# Patient Record
Sex: Female | Born: 1960 | Race: White | Hispanic: No | Marital: Married | Smoking: Never smoker
Health system: Southern US, Community
[De-identification: ages and names within clinical notes are randomized; demographics above are authoritative.]

## PROBLEM LIST (undated history)

## (undated) DIAGNOSIS — N3010 Interstitial cystitis (chronic) without hematuria: Secondary | ICD-10-CM

## (undated) DIAGNOSIS — IMO0002 Reserved for concepts with insufficient information to code with codable children: Secondary | ICD-10-CM

## (undated) DIAGNOSIS — E669 Obesity, unspecified: Secondary | ICD-10-CM

## (undated) DIAGNOSIS — M1990 Unspecified osteoarthritis, unspecified site: Secondary | ICD-10-CM

## (undated) HISTORY — PX: CYSTOSCOPY: SUR368

## (undated) HISTORY — PX: WISDOM TOOTH EXTRACTION: SHX21

---

## 2005-01-26 ENCOUNTER — Ambulatory Visit: Payer: Self-pay | Admitting: Urology

## 2005-04-06 ENCOUNTER — Ambulatory Visit: Payer: Self-pay | Admitting: Obstetrics and Gynecology

## 2006-04-15 ENCOUNTER — Ambulatory Visit: Payer: Self-pay | Admitting: Obstetrics and Gynecology

## 2007-04-19 ENCOUNTER — Ambulatory Visit: Payer: Self-pay | Admitting: Obstetrics and Gynecology

## 2008-05-10 ENCOUNTER — Ambulatory Visit: Payer: Self-pay | Admitting: Unknown Physician Specialty

## 2009-05-13 ENCOUNTER — Ambulatory Visit: Payer: Self-pay | Admitting: Unknown Physician Specialty

## 2009-11-07 ENCOUNTER — Ambulatory Visit: Payer: Self-pay | Admitting: Unknown Physician Specialty

## 2010-05-15 ENCOUNTER — Ambulatory Visit: Payer: Self-pay | Admitting: Unknown Physician Specialty

## 2011-05-18 ENCOUNTER — Ambulatory Visit: Payer: Self-pay | Admitting: Unknown Physician Specialty

## 2012-04-22 ENCOUNTER — Ambulatory Visit: Payer: Self-pay | Admitting: Obstetrics and Gynecology

## 2012-05-24 ENCOUNTER — Ambulatory Visit: Payer: Self-pay | Admitting: Obstetrics and Gynecology

## 2013-06-14 ENCOUNTER — Ambulatory Visit: Payer: Self-pay | Admitting: Obstetrics and Gynecology

## 2013-06-20 ENCOUNTER — Ambulatory Visit: Payer: Self-pay | Admitting: Obstetrics and Gynecology

## 2013-08-23 ENCOUNTER — Ambulatory Visit: Payer: Self-pay | Admitting: Internal Medicine

## 2014-07-13 ENCOUNTER — Ambulatory Visit: Payer: Self-pay | Admitting: Obstetrics and Gynecology

## 2015-06-25 ENCOUNTER — Other Ambulatory Visit: Payer: Self-pay | Admitting: Obstetrics and Gynecology

## 2015-06-25 DIAGNOSIS — N644 Mastodynia: Secondary | ICD-10-CM

## 2015-07-15 ENCOUNTER — Ambulatory Visit
Admission: RE | Admit: 2015-07-15 | Discharge: 2015-07-15 | Disposition: A | Discharge status: Home / Self Care | Payer: BLUE CROSS/BLUE SHIELD | Source: Ambulatory Visit | Attending: Obstetrics and Gynecology | Admitting: Obstetrics and Gynecology

## 2015-07-15 ENCOUNTER — Ambulatory Visit: Payer: Self-pay

## 2015-07-15 DIAGNOSIS — N644 Mastodynia: Secondary | ICD-10-CM

## 2015-07-15 DIAGNOSIS — N63 Unspecified lump in breast: Secondary | ICD-10-CM | POA: Insufficient documentation

## 2015-07-15 DIAGNOSIS — R921 Mammographic calcification found on diagnostic imaging of breast: Secondary | ICD-10-CM | POA: Insufficient documentation

## 2015-07-16 ENCOUNTER — Other Ambulatory Visit: Payer: Self-pay | Admitting: Obstetrics and Gynecology

## 2015-07-16 DIAGNOSIS — R928 Other abnormal and inconclusive findings on diagnostic imaging of breast: Secondary | ICD-10-CM

## 2015-07-16 DIAGNOSIS — N6009 Solitary cyst of unspecified breast: Secondary | ICD-10-CM

## 2015-07-16 DIAGNOSIS — N63 Unspecified lump in unspecified breast: Secondary | ICD-10-CM

## 2015-07-16 DIAGNOSIS — R921 Mammographic calcification found on diagnostic imaging of breast: Secondary | ICD-10-CM

## 2015-07-17 ENCOUNTER — Ambulatory Visit
Admission: RE | Admit: 2015-07-17 | Discharge: 2015-07-17 | Disposition: A | Discharge status: Home / Self Care | Payer: BLUE CROSS/BLUE SHIELD | Source: Ambulatory Visit | Attending: Obstetrics and Gynecology | Admitting: Obstetrics and Gynecology

## 2015-07-17 ENCOUNTER — Other Ambulatory Visit: Payer: Self-pay | Admitting: Diagnostic Radiology

## 2015-07-17 ENCOUNTER — Other Ambulatory Visit: Payer: Self-pay | Admitting: Obstetrics and Gynecology

## 2015-07-17 DIAGNOSIS — N6009 Solitary cyst of unspecified breast: Secondary | ICD-10-CM

## 2015-07-17 DIAGNOSIS — R920 Mammographic microcalcification found on diagnostic imaging of breast: Secondary | ICD-10-CM | POA: Insufficient documentation

## 2015-07-17 DIAGNOSIS — R921 Mammographic calcification found on diagnostic imaging of breast: Secondary | ICD-10-CM

## 2015-07-17 DIAGNOSIS — N63 Unspecified lump in unspecified breast: Secondary | ICD-10-CM

## 2015-07-17 DIAGNOSIS — N6032 Fibrosclerosis of left breast: Secondary | ICD-10-CM | POA: Diagnosis not present

## 2015-07-17 DIAGNOSIS — R928 Other abnormal and inconclusive findings on diagnostic imaging of breast: Secondary | ICD-10-CM

## 2015-07-17 DIAGNOSIS — N6022 Fibroadenosis of left breast: Secondary | ICD-10-CM | POA: Diagnosis not present

## 2015-07-17 DIAGNOSIS — N62 Hypertrophy of breast: Secondary | ICD-10-CM | POA: Diagnosis not present

## 2015-07-17 DIAGNOSIS — O021 Missed abortion: Secondary | ICD-10-CM

## 2015-07-17 HISTORY — PX: BREAST BIOPSY: SHX20

## 2015-07-26 ENCOUNTER — Other Ambulatory Visit: Payer: Self-pay | Admitting: Pediatrics

## 2015-07-26 LAB — SURGICAL PATHOLOGY

## 2015-11-07 ENCOUNTER — Encounter: Payer: Self-pay | Admitting: *Deleted

## 2015-11-08 ENCOUNTER — Ambulatory Visit
Admission: RE | Admit: 2015-11-08 | Discharge: 2015-11-08 | Disposition: A | Discharge status: Home / Self Care | Payer: BLUE CROSS/BLUE SHIELD | Source: Ambulatory Visit | Attending: Unknown Physician Specialty | Admitting: Unknown Physician Specialty

## 2015-11-08 ENCOUNTER — Ambulatory Visit: Payer: BLUE CROSS/BLUE SHIELD | Admitting: Certified Registered Nurse Anesthetist

## 2015-11-08 ENCOUNTER — Encounter
Admission: RE | Disposition: A | Discharge status: Home / Self Care | Payer: Self-pay | Source: Ambulatory Visit | Attending: Unknown Physician Specialty

## 2015-11-08 DIAGNOSIS — E669 Obesity, unspecified: Secondary | ICD-10-CM | POA: Insufficient documentation

## 2015-11-08 DIAGNOSIS — K640 First degree hemorrhoids: Secondary | ICD-10-CM | POA: Diagnosis not present

## 2015-11-08 DIAGNOSIS — D122 Benign neoplasm of ascending colon: Secondary | ICD-10-CM | POA: Diagnosis not present

## 2015-11-08 DIAGNOSIS — Z1211 Encounter for screening for malignant neoplasm of colon: Secondary | ICD-10-CM | POA: Diagnosis present

## 2015-11-08 HISTORY — DX: Reserved for concepts with insufficient information to code with codable children: IMO0002

## 2015-11-08 HISTORY — DX: Interstitial cystitis (chronic) without hematuria: N30.10

## 2015-11-08 HISTORY — PX: COLONOSCOPY: SHX5424

## 2015-11-08 HISTORY — DX: Obesity, unspecified: E66.9

## 2015-11-08 SURGERY — COLONOSCOPY
Anesthesia: General

## 2015-11-08 MED ORDER — MIDAZOLAM HCL 5 MG/5ML IJ SOLN
INTRAMUSCULAR | Status: DC | PRN
  Administered 2015-11-08: 1 mg via INTRAVENOUS

## 2015-11-08 MED ORDER — LIDOCAINE HCL (CARDIAC) 20 MG/ML IV SOLN
INTRAVENOUS | Status: DC | PRN
  Administered 2015-11-08: 20 mg via INTRAVENOUS

## 2015-11-08 MED ORDER — PROPOFOL 10 MG/ML IV BOLUS
INTRAVENOUS | Status: DC | PRN
  Administered 2015-11-08: 40 mg via INTRAVENOUS

## 2015-11-08 MED ORDER — SODIUM CHLORIDE 0.9 % IV SOLN: INTRAVENOUS | Status: DC

## 2015-11-08 MED ORDER — PROPOFOL 500 MG/50ML IV EMUL
INTRAVENOUS | Status: DC | PRN
  Administered 2015-11-08: 1 ug/kg/min via INTRAVENOUS
  Administered 2015-11-08: 140 ug/kg/min via INTRAVENOUS

## 2015-11-08 MED ORDER — LIDOCAINE HCL (PF) 1 % IJ SOLN
2.0000 mL | Freq: Once | INTRAMUSCULAR | Status: AC
  Administered 2015-11-08: 2 mL via INTRADERMAL

## 2015-11-08 MED ORDER — LIDOCAINE HCL (PF) 1 % IJ SOLN
INTRAMUSCULAR | Status: AC
  Filled 2015-11-08: qty 2

## 2015-11-08 MED ORDER — SODIUM CHLORIDE 0.9 % IV SOLN
INTRAVENOUS | Status: DC
  Administered 2015-11-08: 1000 mL via INTRAVENOUS

## 2015-11-08 MED ORDER — FENTANYL CITRATE (PF) 100 MCG/2ML IJ SOLN
INTRAMUSCULAR | Status: DC | PRN
  Administered 2015-11-08: 50 ug via INTRAVENOUS

## 2015-11-08 NOTE — Anesthesia Preprocedure Evaluation (Addendum)
Anesthesia Evaluation  Patient identified by MRN, date of birth, ID band Patient awake    Reviewed: Allergy & Precautions, NPO status , Patient's Chart, lab work & pertinent test results  Airway Mallampati: III  TM Distance: <3 FB     Dental no notable dental hx.    Pulmonary neg pulmonary ROS,    Pulmonary exam normal        Cardiovascular Normal cardiovascular exam     Neuro/Psych negative neurological ROS  negative psych ROS   GI/Hepatic negative GI ROS, Neg liver ROS,   Endo/Other  negative endocrine ROS  Renal/GU negative Renal ROS Bladder dysfunction      Musculoskeletal negative musculoskeletal ROS (+)   Abdominal Normal abdominal exam  (+)   Peds  Hematology negative hematology ROS (+)   Anesthesia Other Findings Interstitial cystitis  Reproductive/Obstetrics                            Anesthesia Physical Anesthesia Plan  ASA: II  Anesthesia Plan: General   Post-op Pain Management:    Induction: Intravenous  Airway Management Planned: Nasal Cannula  Additional Equipment:   Intra-op Plan:   Post-operative Plan:   Informed Consent: I have reviewed the patients History and Physical, chart, labs and discussed the procedure including the risks, benefits and alternatives for the proposed anesthesia with the patient or authorized representative who has indicated his/her understanding and acceptance.   Dental advisory given  Plan Discussed with: CRNA and Surgeon  Anesthesia Plan Comments:         Anesthesia Quick Evaluation

## 2015-11-08 NOTE — H&P (Signed)
   Primary Care Physician:  Laverta Baltimore, MD Primary Gastroenterologist:  Dr. Vira Agar  Pre-Procedure History & Physical: HPI:  Theresa Mcmillan is a 54 y.o. female is here for an colonoscopy.   Past Medical History  Diagnosis Date  . Obesity   . Interstitial cystitis   . ASCUS favor benign     Past Surgical History  Procedure Laterality Date  . Cystoscopy    . Wisdom tooth extraction      Prior to Admission medications   Medication Sig Start Date End Date Taking? Authorizing Provider  pentosan polysulfate (ELMIRON) 100 MG capsule Take 100 mg by mouth 3 (three) times daily.   Yes Historical Provider, MD    Allergies as of 08/15/2015 - never reviewed  Allergen Reaction Noted  . Aleve [naproxen sodium] Hives 07/15/2015  . Tetanus toxoids  07/15/2015    History reviewed. No pertinent family history.  Social History   Social History  . Marital Status: Married    Spouse Name: N/A  . Number of Children: N/A  . Years of Education: N/A   Occupational History  . Not on file.   Social History Main Topics  . Smoking status: Never Smoker   . Smokeless tobacco: Not on file  . Alcohol Use: Not on file  . Drug Use: Not on file  . Sexual Activity: Not on file   Other Topics Concern  . Not on file   Social History Narrative    Review of Systems: See HPI, otherwise negative ROS  Physical Exam: BP 122/63 mmHg  Pulse 71  Temp(Src) 98.3 F (36.8 C) (Tympanic)  Resp 17  Ht 5\' 2"  (1.575 m)  Wt 51.71 kg (114 lb)  BMI 20.85 kg/m2  SpO2 100% General:   Alert,  pleasant and cooperative in NAD Head:  Normocephalic and atraumatic. Neck:  Supple; no masses or thyromegaly. Lungs:  Clear throughout to auscultation.    Heart:  Regular rate and rhythm. Abdomen:  Soft, nontender and nondistended. Normal bowel sounds, without guarding, and without rebound.   Neurologic:  Alert and  oriented x4;  grossly normal neurologically.  Impression/Plan: Theresa Mcmillan is here for an  colonoscopy to be performed for screening  Risks, benefits, limitations, and alternatives regarding  colonoscopy have been reviewed with the patient.  Questions have been answered.  All parties agreeable.   Gaylyn Cheers, MD  11/08/2015, 8:03 AM

## 2015-11-08 NOTE — Anesthesia Procedure Notes (Signed)
Date/Time: 11/08/2015 8:06 AM Performed by: Kennon Holter Pre-anesthesia Checklist: Patient identified, Emergency Drugs available, Suction available, Patient being monitored and Timeout performed Patient Re-evaluated:Patient Re-evaluated prior to inductionOxygen Delivery Method: Nasal cannula Preoxygenation: Pre-oxygenation with 100% oxygen Intubation Type: IV induction

## 2015-11-08 NOTE — Op Note (Signed)
[facility] Gastroenterology Patient Name: Theresa Mcmillan Procedure Date: 11/08/2015 8:04 AM MRN: [id number] Account #: [id number] Date of Birth: [birth year] Admit Type: Outpatient Age: 54 Room: [facility] ENDO ROOM 1 Gender: Female Note Status: Finalized Procedure:         Colonoscopy Indications:       Screening for colorectal malignant neoplasm Providers:         Manya Silvas, MD Medicines:         Propofol per Anesthesia Complications:     No immediate complications. Procedure:         Pre-Anesthesia Assessment:                    - After reviewing the risks and benefits, the patient was                     deemed in satisfactory condition to undergo the procedure.                    After obtaining informed consent, the colonoscope was                     passed under direct vision. Throughout the procedure, the                     patient's blood pressure, pulse, and oxygen saturations                     were monitored continuously. The Colonoscope was                     introduced through the anus and advanced to the the cecum,                     identified by appendiceal orifice and ileocecal valve. The                     colonoscopy was performed without difficulty. The patient                     tolerated the procedure well. The quality of the bowel                     preparation was excellent. Findings:      A very small polyp was found in the proximal ascending colon. The polyp       was sessile. The polyp was removed with a cold snare. Resection and       retrieval were complete.      Internal hemorrhoids were found during endoscopy. The hemorrhoids were       small and Grade I (internal hemorrhoids that do not prolapse).      The exam was otherwise without abnormality. Impression:        - One small polyp in the proximal ascending colon.                     Resected and retrieved.                    - Internal hemorrhoids.                    -  The examination was otherwise normal. Recommendation:    - Await pathology results. Manya Silvas, MD 11/08/2015 8:30:24 AM This report has been signed  electronically. Number of Addenda: 0 Note Initiated On: 11/08/2015 8:04 AM Scope Withdrawal Time: 0 hours 12 minutes 2 seconds  Total Procedure Duration: 0 hours 19 minutes 34 seconds       [facility]

## 2015-11-08 NOTE — Transfer of Care (Signed)
Immediate Anesthesia Transfer of Care Note  Patient: Theresa Mcmillan  Procedure(s) Performed: Procedure(s): COLONOSCOPY (N/A)  Patient Location: PACU and Endoscopy Unit  Anesthesia Type:General  Level of Consciousness: awake  Airway & Oxygen Therapy: Patient Spontanous Breathing and Patient connected to nasal cannula oxygen  Post-op Assessment: Report given to RN and Post -op Vital signs reviewed and stable  Post vital signs: Reviewed and stable  Last Vitals:  Filed Vitals:   11/08/15 0834  BP:   Pulse:   Temp: 36 C  Resp:     Complications: No apparent anesthesia complications

## 2015-11-09 NOTE — Anesthesia Postprocedure Evaluation (Signed)
  Anesthesia Post-op Note  Patient: Theresa Mcmillan  Procedure(s) Performed: Procedure(s): COLONOSCOPY (N/A)  Anesthesia type:General  Patient location: PACU  Post pain: Pain level controlled  Post assessment: Post-op Vital signs reviewed, Patient's Cardiovascular Status Stable, Respiratory Function Stable, Patent Airway and No signs of Nausea or vomiting  Post vital signs: Reviewed and stable  Last Vitals:  Filed Vitals:   11/08/15 0918  BP:   Pulse: 62  Temp:   Resp: 14    Level of consciousness: awake, alert  and patient cooperative  Complications: No apparent anesthesia complications

## 2015-11-11 LAB — SURGICAL PATHOLOGY

## 2015-11-13 ENCOUNTER — Encounter: Payer: Self-pay | Admitting: Unknown Physician Specialty

## 2015-12-02 ENCOUNTER — Other Ambulatory Visit: Payer: Self-pay | Admitting: Obstetrics and Gynecology

## 2015-12-02 DIAGNOSIS — R921 Mammographic calcification found on diagnostic imaging of breast: Secondary | ICD-10-CM

## 2015-12-02 DIAGNOSIS — N6002 Solitary cyst of left breast: Secondary | ICD-10-CM

## 2016-01-21 ENCOUNTER — Other Ambulatory Visit: Payer: Self-pay | Admitting: Obstetrics and Gynecology

## 2016-01-21 ENCOUNTER — Ambulatory Visit
Admission: RE | Admit: 2016-01-21 | Discharge: 2016-01-21 | Disposition: A | Discharge status: Home / Self Care | Payer: BLUE CROSS/BLUE SHIELD | Source: Ambulatory Visit | Attending: Obstetrics and Gynecology | Admitting: Obstetrics and Gynecology

## 2016-01-21 DIAGNOSIS — R921 Mammographic calcification found on diagnostic imaging of breast: Secondary | ICD-10-CM

## 2016-01-21 DIAGNOSIS — R928 Other abnormal and inconclusive findings on diagnostic imaging of breast: Secondary | ICD-10-CM | POA: Diagnosis not present

## 2016-01-21 DIAGNOSIS — N6002 Solitary cyst of left breast: Secondary | ICD-10-CM

## 2016-02-04 ENCOUNTER — Other Ambulatory Visit: Payer: Self-pay | Admitting: Obstetrics and Gynecology

## 2016-02-04 DIAGNOSIS — N649 Disorder of breast, unspecified: Secondary | ICD-10-CM

## 2016-02-04 DIAGNOSIS — R928 Other abnormal and inconclusive findings on diagnostic imaging of breast: Secondary | ICD-10-CM

## 2016-07-15 ENCOUNTER — Ambulatory Visit
Admission: RE | Admit: 2016-07-15 | Discharge: 2016-07-15 | Disposition: A | Discharge status: Home / Self Care | Payer: BLUE CROSS/BLUE SHIELD | Source: Ambulatory Visit | Attending: Obstetrics and Gynecology | Admitting: Obstetrics and Gynecology

## 2016-07-15 ENCOUNTER — Other Ambulatory Visit: Payer: Self-pay | Admitting: Obstetrics and Gynecology

## 2016-07-15 DIAGNOSIS — R928 Other abnormal and inconclusive findings on diagnostic imaging of breast: Secondary | ICD-10-CM

## 2016-07-15 DIAGNOSIS — N649 Disorder of breast, unspecified: Secondary | ICD-10-CM

## 2017-07-21 ENCOUNTER — Other Ambulatory Visit: Payer: Self-pay | Admitting: Obstetrics and Gynecology

## 2017-07-21 DIAGNOSIS — N6320 Unspecified lump in the left breast, unspecified quadrant: Secondary | ICD-10-CM

## 2017-08-03 ENCOUNTER — Other Ambulatory Visit: Payer: BLUE CROSS/BLUE SHIELD

## 2017-09-06 ENCOUNTER — Ambulatory Visit
Admission: RE | Admit: 2017-09-06 | Discharge: 2017-09-06 | Disposition: A | Discharge status: Home / Self Care | Payer: BC Managed Care – PPO | Source: Ambulatory Visit | Attending: Obstetrics and Gynecology | Admitting: Obstetrics and Gynecology

## 2017-09-06 DIAGNOSIS — N6321 Unspecified lump in the left breast, upper outer quadrant: Secondary | ICD-10-CM | POA: Insufficient documentation

## 2017-09-06 DIAGNOSIS — N6320 Unspecified lump in the left breast, unspecified quadrant: Secondary | ICD-10-CM

## 2018-08-16 ENCOUNTER — Other Ambulatory Visit: Payer: Self-pay | Admitting: Obstetrics and Gynecology

## 2018-08-16 DIAGNOSIS — N63 Unspecified lump in unspecified breast: Secondary | ICD-10-CM

## 2018-09-07 ENCOUNTER — Ambulatory Visit
Admission: RE | Admit: 2018-09-07 | Discharge: 2018-09-07 | Disposition: A | Discharge status: Home / Self Care | Payer: BC Managed Care – PPO | Source: Ambulatory Visit | Attending: Obstetrics and Gynecology | Admitting: Obstetrics and Gynecology

## 2018-09-07 DIAGNOSIS — N63 Unspecified lump in unspecified breast: Secondary | ICD-10-CM

## 2019-09-25 ENCOUNTER — Other Ambulatory Visit: Payer: Self-pay

## 2019-09-25 DIAGNOSIS — Z20822 Contact with and (suspected) exposure to covid-19: Secondary | ICD-10-CM

## 2019-09-26 LAB — NOVEL CORONAVIRUS, NAA: SARS-CoV-2, NAA: NOT DETECTED

## 2019-10-10 ENCOUNTER — Other Ambulatory Visit: Payer: Self-pay | Admitting: Obstetrics and Gynecology

## 2019-10-10 DIAGNOSIS — Z1231 Encounter for screening mammogram for malignant neoplasm of breast: Secondary | ICD-10-CM

## 2019-11-09 ENCOUNTER — Ambulatory Visit: Payer: BC Managed Care – PPO

## 2020-01-22 ENCOUNTER — Other Ambulatory Visit: Payer: Self-pay

## 2020-01-22 DIAGNOSIS — Z20822 Contact with and (suspected) exposure to covid-19: Secondary | ICD-10-CM

## 2020-01-23 LAB — NOVEL CORONAVIRUS, NAA: SARS-CoV-2, NAA: NOT DETECTED

## 2020-01-24 ENCOUNTER — Ambulatory Visit
Admission: RE | Admit: 2020-01-24 | Discharge: 2020-01-24 | Disposition: A | Discharge status: Home / Self Care | Payer: BC Managed Care – PPO | Source: Ambulatory Visit | Attending: Obstetrics and Gynecology | Admitting: Obstetrics and Gynecology

## 2020-01-24 DIAGNOSIS — Z1231 Encounter for screening mammogram for malignant neoplasm of breast: Secondary | ICD-10-CM | POA: Diagnosis present

## 2020-10-15 ENCOUNTER — Other Ambulatory Visit: Payer: Self-pay | Admitting: Obstetrics and Gynecology

## 2020-10-15 DIAGNOSIS — Z1231 Encounter for screening mammogram for malignant neoplasm of breast: Secondary | ICD-10-CM

## 2021-01-27 ENCOUNTER — Other Ambulatory Visit: Payer: Self-pay

## 2021-01-27 ENCOUNTER — Other Ambulatory Visit: Payer: Self-pay | Admitting: Obstetrics and Gynecology

## 2021-01-27 ENCOUNTER — Ambulatory Visit
Admission: RE | Admit: 2021-01-27 | Discharge: 2021-01-27 | Disposition: A | Discharge status: Home / Self Care | Payer: BC Managed Care – PPO | Source: Ambulatory Visit | Attending: Obstetrics and Gynecology | Admitting: Obstetrics and Gynecology

## 2021-01-27 DIAGNOSIS — N644 Mastodynia: Secondary | ICD-10-CM

## 2021-01-27 DIAGNOSIS — Z1231 Encounter for screening mammogram for malignant neoplasm of breast: Secondary | ICD-10-CM | POA: Insufficient documentation

## 2021-02-13 ENCOUNTER — Other Ambulatory Visit: Payer: Self-pay | Admitting: Obstetrics and Gynecology

## 2021-02-13 DIAGNOSIS — N644 Mastodynia: Secondary | ICD-10-CM

## 2021-02-21 ENCOUNTER — Ambulatory Visit
Admission: RE | Admit: 2021-02-21 | Discharge: 2021-02-21 | Disposition: A | Discharge status: Home / Self Care | Payer: BC Managed Care – PPO | Source: Ambulatory Visit | Attending: Obstetrics and Gynecology | Admitting: Obstetrics and Gynecology

## 2021-02-21 ENCOUNTER — Other Ambulatory Visit: Payer: Self-pay

## 2021-02-21 ENCOUNTER — Other Ambulatory Visit: Payer: Self-pay | Admitting: Obstetrics and Gynecology

## 2021-02-21 DIAGNOSIS — N644 Mastodynia: Secondary | ICD-10-CM

## 2021-02-21 DIAGNOSIS — Z1231 Encounter for screening mammogram for malignant neoplasm of breast: Secondary | ICD-10-CM | POA: Diagnosis not present

## 2021-10-24 ENCOUNTER — Other Ambulatory Visit: Payer: Self-pay | Admitting: Obstetrics and Gynecology

## 2021-10-24 DIAGNOSIS — Z1231 Encounter for screening mammogram for malignant neoplasm of breast: Secondary | ICD-10-CM

## 2022-02-23 ENCOUNTER — Other Ambulatory Visit: Payer: Self-pay

## 2022-02-23 ENCOUNTER — Ambulatory Visit
Admission: RE | Admit: 2022-02-23 | Discharge: 2022-02-23 | Disposition: A | Discharge status: Home / Self Care | Payer: BC Managed Care – PPO | Source: Ambulatory Visit | Attending: Obstetrics and Gynecology | Admitting: Obstetrics and Gynecology

## 2022-02-23 DIAGNOSIS — Z1231 Encounter for screening mammogram for malignant neoplasm of breast: Secondary | ICD-10-CM | POA: Diagnosis not present

## 2022-04-01 ENCOUNTER — Ambulatory Visit
Admission: RE | Admit: 2022-04-01 | Discharge: 2022-04-01 | Disposition: A | Discharge status: Home / Self Care | Payer: BC Managed Care – PPO | Source: Ambulatory Visit | Attending: Nurse Practitioner | Admitting: Nurse Practitioner

## 2022-04-01 ENCOUNTER — Other Ambulatory Visit: Payer: Self-pay | Admitting: Nurse Practitioner

## 2022-04-01 DIAGNOSIS — R1011 Right upper quadrant pain: Secondary | ICD-10-CM | POA: Insufficient documentation

## 2022-04-01 MED ORDER — IOHEXOL 300 MG/ML  SOLN
80.0000 mL | Freq: Once | INTRAMUSCULAR | Status: AC | PRN
  Administered 2022-04-01: 80 mL via INTRAVENOUS

## 2022-04-01 NOTE — Progress Notes (Signed)
The patient underwent CT imaging today at [facility] as an outpatient and received IV iodinated contrast. Immediately after receiving IV contrast the patient developed a few small hives on her neck. The patient was observed for 15 minutes and had a few additional small hives on her back and left arm with tongue itching which then improved over the next 10 minute duration. She denied any tongue swelling, mouth swelling, shortness of breath or chest pain. She was given the option to receive benadryl and have a friend or family member come to drive her home or to wait and be observed for 45 minutes at the hospital. She stated she would prefer to stay and be observed rather than find someone to come drive her home. She was observed during the 45 minutes without any worsening or new symptoms and her hives improved in that time frame. She was instructed to take Benadryl at home when she arrived home and stated she lives close by and has Benadryl there. She was instructed if her symptoms worsened or she developed any swelling, shortness of breath, chest pain or difficulty breathing to call 911 or go to the ED, she verbalized understanding of this plan today. She was given a contact number for our CT department as well if she has any questions. She does mention she has allergies to other things such as shrimp and usually she gets hives and benadryl works, she denies any prior history of anaphylaxis. Dr. Leonia Reeves is my attending and was made aware of the above and agrees with the above plan.  ? ?Tsosie Billing D, PA-C ?04/01/2022, 2:29 PM ? ? ? ?

## 2022-04-17 ENCOUNTER — Ambulatory Visit: Payer: Self-pay | Admitting: General Surgery

## 2022-04-17 NOTE — H&P (Signed)
?Chief Complaint: Abdominal Pain ?  ?  ?  ?History of Present Illness: ?Theresa Mcmillan is a 61 y.o. female who is seen today as an office consultation at the request of Dr. Pasty Arch for evaluation of Abdominal Pain ?Marland Kitchen   ?Patient is a 61 year old female who comes in with history of hypertension, diabetes, and history of gallstones.  Patient states that she has had some pain in the right upper quadrant that radiates to the back and sometimes shoulder.  She states that she cannot associate this with food.  She states pretty sporadic.  She states that she does have some increased diarrhea.  She does state that she has some nausea however no emesis. ?  ?Patient recently had a CT scan which I reviewed personally.  CT scan does show multiple large gallstones. ?  ?Patient's had no previous abdominal surgery. ?  ?  ?  ?Review of Systems: ?A complete review of systems was obtained from the patient.  I have reviewed this information and discussed as appropriate with the patient.  See HPI as well for other ROS. ?  ?Review of Systems  ?Constitutional: Negative for fever.  ?HENT: Negative for congestion.   ?Eyes: Negative for blurred vision.  ?Respiratory: Negative for cough, shortness of breath and wheezing.   ?Cardiovascular: Negative for chest pain and palpitations.  ?Gastrointestinal: Negative for heartburn.  ?Genitourinary: Negative for dysuria.  ?Musculoskeletal: Negative for myalgias.  ?Skin: Negative for rash.  ?Neurological: Negative for dizziness and headaches.  ?Psychiatric/Behavioral: Negative for depression and suicidal ideas.  ?All other systems reviewed and are negative. ?  ?  ?  ?Medical History: ?Past Medical History ?Past Medical History: ?Diagnosis Date ? ASCUS of cervix with negative high risk HPV   ? Interstitial cystitis   ? Obesity   ? ?  ?  ?Patient Active Problem List ?Diagnosis ? Interstitial cystitis ? Obesity ? PMB (postmenopausal bleeding) ? Abdominal pain, RUQ (right upper quadrant) ? Epigastric  pain ? Diarrhea ?  ?  ?Past Surgical History ?Past Surgical History: ?Procedure Laterality Date ? COLONOSCOPY   11/08/2015 ?  Dr. Kennis Carina @ [place] - Adenomatous Polyp, FHPolyps(m)(f)): CBF 10/2020 ? COLONOSCOPY   03/25/2021 ?  Tubular adenomas/PHx CP/Repeat 54yr/TKT ? ?  ?  ?Allergies ?Allergies ?Allergen Reactions ? Iodinated Contrast Media Hives ? Aleve [Naproxen Sodium] Hives ? Tetanus Toxoid Adsorbed Swelling ?    Severe redness and swelling at injection site ? Thimerosal Unknown ?    Noted on allergy skin test ? ?  ?  ?Current Outpatient Medications on File Prior to Visit ?Medication Sig Dispense Refill ? meloxicam (MOBIC) 7.5 MG tablet Take 7.5 mg by mouth 2 (two) times daily (Patient not taking: Reported on 04/01/2022)     ? mirabegron (MYRBETRIQ) 25 mg ER Tablet Take 1 tablet (25 mg total) by mouth once daily (Patient not taking: Reported on 02/17/2021) 30 tablet 11 ? pentosan polysulfate (ELMIRON) 100 mg capsule Take 1 capsule (100 mg total) by mouth 3 (three) times daily before meals 90 capsule 11 ? sodium, potassium, and magnesium (SUPREP) oral solution Take 2 Bottles (1 kit total) by mouth as directed One kit contains 2 bottles.  Take both bottles at the times instructed by your provider. (Patient not taking: Reported on 10/17/2021) 354 mL 0 ?  ?No current facility-administered medications on file prior to visit. ?  ?  ?Family History ?Family History ?Problem Relation Age of Onset ? Diabetes Mother   ? High blood pressure (Hypertension) Mother   ?  Colon polyps Mother   ? Colon polyps Father   ? High blood pressure (Hypertension) Father   ? ?  ?  ?Social History ?  ?Tobacco Use ?Smoking Status Never ?Smokeless Tobacco Never ?  ?  ?Social History ?Social History ?  ? ?Socioeconomic History ? Marital status: Married ?Tobacco Use ? Smoking status: Never ? Smokeless tobacco: Never ?Substance and Sexual Activity ? Alcohol use: No ? Drug use: No ? Sexual activity: Yes ?    Partners: Male ?    Birth  control/protection: Other-see comments ?    Comment: vasectomy ? ?  ?  ?Objective: ?  ?  ?Vitals: ?  04/17/22 0853 ?BP: 118/62 ?Pulse: 87 ?Temp: 36.7 ?C (98.1 ?F) ?SpO2: 98% ?Weight: 69.6 kg (153 lb 8 oz) ?Height: 158.8 cm (5' 2.5") ?  ?Body mass index is 27.63 kg/m?. ?  ?Physical Exam ?Constitutional:   ?   Appearance: Normal appearance.  ?HENT:  ?   Head: Normocephalic and atraumatic.  ?   Mouth/Throat:  ?   Mouth: Mucous membranes are moist.  ?   Pharynx: Oropharynx is clear.  ?Eyes:  ?   General: No scleral icterus. ?   Pupils: Pupils are equal, round, and reactive to light.  ?Cardiovascular:  ?   Rate and Rhythm: Normal rate and regular rhythm.  ?   Pulses: Normal pulses.  ?   Heart sounds: No murmur heard. ?  No friction rub. No gallop.  ?Pulmonary:  ?   Effort: Pulmonary effort is normal. No respiratory distress.  ?   Breath sounds: Normal breath sounds. No stridor.  ?Abdominal:  ?   General: Abdomen is flat.  ?Musculoskeletal:     ?   General: No swelling.  ?Skin: ?   General: Skin is warm.  ?Neurological:  ?   General: No focal deficit present.  ?   Mental Status: She is alert and oriented to person, place, and time. Mental status is at baseline.  ?Psychiatric:     ?   Mood and Affect: Mood normal.     ?   Thought Content: Thought content normal.     ?   Judgment: Judgment normal.  ?  ?  ?  ?Assessment and Plan: ?Diagnoses and all orders for this visit: ?  ?Symptomatic cholelithiasis ?  ?  ?Theresa Mcmillan is a 61 y.o. female  ?  ?1.  We will proceed to the OR for a lap cholecystectomy. ?2. All risks and benefits were discussed with the patient to generally include: infection, bleeding, possible need for post op ERCP, damage to the bile ducts, and bile leak. Alternatives were offered and described.  All questions were answered and the patient voiced understanding of the procedure and wishes to proceed at this point with a laparoscopic cholecystectomy ?  ?  ?  ?  ?  ?No follow-ups on file. ?  ?Ralene Ok,  MD, FACS ?[facility], [place] ?General & Minimally Invasive Surgery ? ?

## 2022-04-17 NOTE — H&P (View-Only) (Signed)
?Chief Complaint: Abdominal Pain ?  ?  ?  ?History of Present Illness: ?Theresa Mcmillan is a 61 y.o. female who is seen today as an office consultation at the request of Dr. Self for evaluation of Abdominal Pain ?.   ?Patient is a 61-year-old female who comes in with history of hypertension, diabetes, and history of gallstones.  Patient states that she has had some pain in the right upper quadrant that radiates to the back and sometimes shoulder.  She states that she cannot associate this with food.  She states pretty sporadic.  She states that she does have some increased diarrhea.  She does state that she has some nausea however no emesis. ?  ?Patient recently had a CT scan which I reviewed personally.  CT scan does show multiple large gallstones. ?  ?Patient's had no previous abdominal surgery. ?  ?  ?  ?Review of Systems: ?A complete review of systems was obtained from the patient.  I have reviewed this information and discussed as appropriate with the patient.  See HPI as well for other ROS. ?  ?Review of Systems  ?Constitutional: Negative for fever.  ?HENT: Negative for congestion.   ?Eyes: Negative for blurred vision.  ?Respiratory: Negative for cough, shortness of breath and wheezing.   ?Cardiovascular: Negative for chest pain and palpitations.  ?Gastrointestinal: Negative for heartburn.  ?Genitourinary: Negative for dysuria.  ?Musculoskeletal: Negative for myalgias.  ?Skin: Negative for rash.  ?Neurological: Negative for dizziness and headaches.  ?Psychiatric/Behavioral: Negative for depression and suicidal ideas.  ?All other systems reviewed and are negative. ?  ?  ?  ?Medical History: ?Past Medical History ?Past Medical History: ?Diagnosis Date ? ASCUS of cervix with negative high risk HPV   ? Interstitial cystitis   ? Obesity   ? ?  ?  ?Patient Active Problem List ?Diagnosis ? Interstitial cystitis ? Obesity ? PMB (postmenopausal bleeding) ? Abdominal pain, RUQ (right upper quadrant) ? Epigastric  pain ? Diarrhea ?  ?  ?Past Surgical History ?Past Surgical History: ?Procedure Laterality Date ? COLONOSCOPY   11/08/2015 ?  Dr. R. Elliott @ [facility] - Adenomatous Polyp, FHPolyps(m)(f)): CBF 10/2020 ? COLONOSCOPY   03/25/2021 ?  Tubular adenomas/PHx CP/Repeat 3yrs/TKT ? ?  ?  ?Allergies ?Allergies ?Allergen Reactions ? Iodinated Contrast Media Hives ? Aleve [Naproxen Sodium] Hives ? Tetanus Toxoid Adsorbed Swelling ?    Severe redness and swelling at injection site ? Thimerosal Unknown ?    Noted on allergy skin test ? ?  ?  ?Current Outpatient Medications on File Prior to Visit ?Medication Sig Dispense Refill ? meloxicam (MOBIC) 7.5 MG tablet Take 7.5 mg by mouth 2 (two) times daily (Patient not taking: Reported on 04/01/2022)     ? mirabegron (MYRBETRIQ) 25 mg ER Tablet Take 1 tablet (25 mg total) by mouth once daily (Patient not taking: Reported on 02/17/2021) 30 tablet 11 ? pentosan polysulfate (ELMIRON) 100 mg capsule Take 1 capsule (100 mg total) by mouth 3 (three) times daily before meals 90 capsule 11 ? sodium, potassium, and magnesium (SUPREP) oral solution Take 2 Bottles (1 kit total) by mouth as directed One kit contains 2 bottles.  Take both bottles at the times instructed by your provider. (Patient not taking: Reported on 10/17/2021) 354 mL 0 ?  ?No current facility-administered medications on file prior to visit. ?  ?  ?Family History ?Family History ?Problem Relation Age of Onset ? Diabetes Mother   ? High blood pressure (Hypertension) Mother   ?   Colon polyps Mother   ? Colon polyps Father   ? High blood pressure (Hypertension) Father   ? ?  ?  ?Social History ?  ?Tobacco Use ?Smoking Status Never ?Smokeless Tobacco Never ?  ?  ?Social History ?Social History ?  ? ?Socioeconomic History ? Marital status: Married ?Tobacco Use ? Smoking status: Never ? Smokeless tobacco: Never ?Substance and Sexual Activity ? Alcohol use: No ? Drug use: No ? Sexual activity: Yes ?    Partners: Male ?    Birth  control/protection: Other-see comments ?    Comment: vasectomy ? ?  ?  ?Objective: ?  ?  ?Vitals: ?  04/17/22 0853 ?BP: 118/62 ?Pulse: 87 ?Temp: 36.7 ?C (98.1 ?F) ?SpO2: 98% ?Weight: 69.6 kg (153 lb 8 oz) ?Height: 158.8 cm (5' 2.5") ?  ?Body mass index is 27.63 kg/m?. ?  ?Physical Exam ?Constitutional:   ?   Appearance: Normal appearance.  ?HENT:  ?   Head: Normocephalic and atraumatic.  ?   Mouth/Throat:  ?   Mouth: Mucous membranes are moist.  ?   Pharynx: Oropharynx is clear.  ?Eyes:  ?   General: No scleral icterus. ?   Pupils: Pupils are equal, round, and reactive to light.  ?Cardiovascular:  ?   Rate and Rhythm: Normal rate and regular rhythm.  ?   Pulses: Normal pulses.  ?   Heart sounds: No murmur heard. ?  No friction rub. No gallop.  ?Pulmonary:  ?   Effort: Pulmonary effort is normal. No respiratory distress.  ?   Breath sounds: Normal breath sounds. No stridor.  ?Abdominal:  ?   General: Abdomen is flat.  ?Musculoskeletal:     ?   General: No swelling.  ?Skin: ?   General: Skin is warm.  ?Neurological:  ?   General: No focal deficit present.  ?   Mental Status: She is alert and oriented to person, place, and time. Mental status is at baseline.  ?Psychiatric:     ?   Mood and Affect: Mood normal.     ?   Thought Content: Thought content normal.     ?   Judgment: Judgment normal.  ?  ?  ?  ?Assessment and Plan: ?Diagnoses and all orders for this visit: ?  ?Symptomatic cholelithiasis ?  ?  ?Theresa Mcmillan is a 61 y.o. female  ?  ?1.  We will proceed to the OR for a lap cholecystectomy. ?2. All risks and benefits were discussed with the patient to generally include: infection, bleeding, possible need for post op ERCP, damage to the bile ducts, and bile leak. Alternatives were offered and described.  All questions were answered and the patient voiced understanding of the procedure and wishes to proceed at this point with a laparoscopic cholecystectomy ?  ?  ?  ?  ?  ?No follow-ups on file. ?  ?Raven Harmes,  MD, FACS ?[facility] ?General & Minimally Invasive Surgery ? ?

## 2022-04-30 ENCOUNTER — Other Ambulatory Visit: Payer: Self-pay

## 2022-04-30 ENCOUNTER — Encounter (HOSPITAL_COMMUNITY)
Admission: RE | Admit: 2022-04-30 | Discharge: 2022-04-30 | Disposition: A | Discharge status: Home / Self Care | Payer: BC Managed Care – PPO | Source: Ambulatory Visit | Attending: General Surgery | Admitting: General Surgery

## 2022-04-30 ENCOUNTER — Encounter (HOSPITAL_COMMUNITY): Payer: Self-pay

## 2022-04-30 DIAGNOSIS — Z01818 Encounter for other preprocedural examination: Secondary | ICD-10-CM | POA: Diagnosis present

## 2022-04-30 HISTORY — DX: Unspecified osteoarthritis, unspecified site: M19.90

## 2022-04-30 NOTE — Progress Notes (Signed)
PCP - Laverta Baltimore MD ?Cardiologist - denies ? ?PPM/ICD - denies ?Device Orders -  ?Rep Notified -  ? ?Chest x-ray - 04/08/22 ?EKG - denies ?Stress Test - denies ?ECHO - denies ?Cardiac Cath - denies ? ?Sleep Study - none ?CPAP - na ? ?Fasting Blood Sugar - na ?Checks Blood Sugar _____ times a day ? ?Blood Thinner Instructions:na ?Aspirin Instructions:na ? ?ERAS Protcol -clear liquids until 0530 ?PRE-SURGERY Ensure or G2- Ensure ? ?COVID TEST- no ? ? ?Anesthesia review: no ? ?Patient denies shortness of breath, fever, cough and chest pain at PAT appointment ? ? ?All instructions explained to the patient, with a verbal understanding of the material. Patient agrees to go over the instructions while at home for a better understanding. Patient also instructed to wear a mask when out in public prior to surgery.  The opportunity to ask questions was provided. ?  ?

## 2022-04-30 NOTE — Progress Notes (Addendum)
Surgical Instructions ? ? ? Your procedure is scheduled on May 10,2023. ? Report to [facility] Main Entrance "A" at 6:30 A.M., then check in with the Admitting office. ? Call this number if you have problems the morning of surgery: ? [phone] ? ? If you have any questions prior to your surgery date call [phone]: Open Monday-Friday 8am-4pm ? ? ? Remember: ? Do not eat after midnight the night before your surgery ? ?You may drink clear liquids until 5:30am the morning of your surgery.   ?Clear liquids allowed are: Water, Non-Citrus Juices (without pulp), Carbonated Beverages, Clear Tea, Black Coffee ONLY (NO MILK, CREAM OR POWDERED CREAMER of any kind), and Gatorade ? ?The night before surgery:  ?No food after midnight. ONLY clear liquids after midnight ? ?The day of surgery (if you do NOT have diabetes):  ?Drink ONE (1) Pre-Surgery Clear Ensure by 5:30am the morning of surgery. Drink in one sitting. Do not sip.  ?This drink was given to you during your hospital  ?pre-op appointment visit. ? ?Nothing else to drink after completing the  ?Pre-Surgery Clear Ensure. ? ?       If you have questions, please contact your surgeon?s office.  ? ?  ? Take these medicines the morning of surgery with A SIP OF WATER:  ?pentosan polysulfate (ELMIRON) ? ? ?As of today, STOP taking any Aspirin (unless otherwise instructed by your surgeon),meloxicam (MOBIC),Aleve, Naproxen, Ibuprofen, Motrin, Advil, Goody's, BC's, all herbal medications, fish oil, and all vitamins. ? ?         ?Do not wear jewelry or makeup ?Do not wear lotions, powders, perfumes/colognes, or deodorant. ?Do not shave 48 hours prior to surgery.  Men may shave face and neck. ?Do not bring valuables to the hospital. ?Do not wear nail polish, gel polish, artificial nails, or any other type of covering on natural nails (fingers and toes) ?If you have artificial nails or gel coating that need to be removed by a nail salon, please have this removed prior to surgery.  Artificial nails or gel coating may interfere with anesthesia's ability to adequately monitor your vital signs. ? ?[facility] is not responsible for any belongings or valuables. .  ? ?Do NOT Smoke (Tobacco/Vaping)  24 hours prior to your procedure ? ?If you use a CPAP at night, you may bring your mask for your overnight stay. ?  ?Contacts, glasses, hearing aids, dentures or partials may not be worn into surgery, please bring cases for these belongings ?  ?For patients admitted to the hospital, discharge time will be determined by your treatment team. ?  ?Patients discharged the day of surgery will not be allowed to drive home, and someone needs to stay with them for 24 hours. ? ? ?SURGICAL WAITING ROOM VISITATION ?Patients having surgery or a procedure in a hospital may have two support people. ?Children under the age of 99 must have an adult with them who is not the patient. ?They may stay in the waiting area during the procedure and may switch out with other visitors. If the patient needs to stay at the hospital during part of their recovery, the visitor guidelines for inpatient rooms apply. ? ?Please refer to the [facility] website for the visitor guidelines for Inpatients (after your surgery is over and you are in a regular room).  ? ? ? ? ? ?Special instructions:   ? ?Oral Hygiene is also important to reduce your risk of infection.  Remember - BRUSH YOUR TEETH THE  MORNING OF SURGERY WITH YOUR REGULAR TOOTHPASTE ? ? ?[facility]- Preparing For Surgery ? ?Before surgery, you can play an important role. Because skin is not sterile, your skin needs to be as free of germs as possible. You can reduce the number of germs on your skin by washing with CHG (chlorahexidine gluconate) Soap before surgery.  CHG is an antiseptic cleaner which kills germs and bonds with the skin to continue killing germs even after washing.   ? ? ?Please do not use if you have an allergy to CHG or antibacterial soaps. If your skin becomes  reddened/irritated stop using the CHG.  ?Do not shave (including legs and underarms) for at least 48 hours prior to first CHG shower. It is OK to shave your face. ? ?Please follow these instructions carefully. ?  ? ? Shower the NIGHT BEFORE SURGERY and the MORNING OF SURGERY with CHG Soap.  ? If you chose to wash your hair, wash your hair first as usual with your normal shampoo. After you shampoo, rinse your hair and body thoroughly to remove the shampoo.  Then [employer] and genitals (private parts) with your normal soap and rinse thoroughly to remove soap. ? ?After that Use CHG Soap as you would any other liquid soap. You can apply CHG directly to the skin and wash gently with a scrungie or a clean washcloth.  ? ?Apply the CHG Soap to your body ONLY FROM THE NECK DOWN.  Do not use on open wounds or open sores. Avoid contact with your eyes, ears, mouth and genitals (private parts). Wash Face and genitals (private parts)  with your normal soap.  ? ?Wash thoroughly, paying special attention to the area where your surgery will be performed. ? ?Thoroughly rinse your body with warm water from the neck down. ? ?DO NOT shower/wash with your normal soap after using and rinsing off the CHG Soap. ? ?Pat yourself dry with a CLEAN TOWEL. ? ?Wear CLEAN PAJAMAS to bed the night before surgery ? ?Place CLEAN SHEETS on your bed the night before your surgery ? ?DO NOT SLEEP WITH PETS. ? ? ?Day of Surgery: ? ?Take a shower with CHG soap. ?Wear Clean/Comfortable clothing the morning of surgery ?Do not apply any deodorants/lotions.   ?Remember to brush your teeth WITH YOUR REGULAR TOOTHPASTE. ? ? ? ?If you received a COVID test during your pre-op visit, it is requested that you wear a mask when out in public, stay away from anyone that may not be feeling well, and notify your surgeon if you develop symptoms. If you have been in contact with anyone that has tested positive in the last 10 days, please notify your surgeon. ? ?  ?Please  read over the following fact sheets that you were given.   ?

## 2022-05-05 NOTE — Anesthesia Preprocedure Evaluation (Addendum)
Anesthesia Evaluation  ?Patient identified by MRN, date of birth, ID band ?Patient awake ? ? ? ?Reviewed: ?Allergy & Precautions, H&P , NPO status , Patient's Chart, lab work & pertinent test results ? ?History of Anesthesia Complications ?Negative for: history of anesthetic complications ? ?Airway ?Mallampati: II ? ? ?Neck ROM: full ? ? ? Dental ?  ?Pulmonary ?neg pulmonary ROS,  ?  ?breath sounds clear to auscultation ? ? ? ? ? ? Cardiovascular ?negative cardio ROS ? ? ?Rhythm:regular Rate:Normal ? ? ?  ?Neuro/Psych ?negative neurological ROS ?   ? GI/Hepatic ?Neg liver ROS, Gallstones ?  ?Endo/Other  ?negative endocrine ROS ? Renal/GU ?negative Renal ROS  ? ?Interstitial cystitis ? ?  ?Musculoskeletal ? ?(+) Arthritis ,  ? Abdominal ?  ?Peds ? Hematology ?negative hematology ROS ?(+)   ?Anesthesia Other Findings ? ? Reproductive/Obstetrics ? ?  ? ? ? ? ? ? ? ? ? ? ? ? ? ?  ?  ? ? ? ? ? ? ? ?Anesthesia Physical ?Anesthesia Plan ? ?ASA: 2 ? ?Anesthesia Plan: General  ? ?Post-op Pain Management: Tylenol PO (pre-op)* and Toradol IV (intra-op)*  ? ?Induction: Intravenous ? ?PONV Risk Score and Plan: 4 or greater and Ondansetron, Dexamethasone, Treatment may vary due to age or medical condition and Midazolam ? ?Airway Management Planned: Oral ETT ? ?Additional Equipment: None ? ?Intra-op Plan:  ? ?Post-operative Plan: Extubation in OR ? ?Informed Consent: I have reviewed the patients History and Physical, chart, labs and discussed the procedure including the risks, benefits and alternatives for the proposed anesthesia with the patient or authorized representative who has indicated his/her understanding and acceptance.  ? ? ? ?Dental advisory given ? ?Plan Discussed with: CRNA, Anesthesiologist and Surgeon ? ?Anesthesia Plan Comments:   ? ? ? ? ? ?Anesthesia Quick Evaluation ? ?

## 2022-05-06 ENCOUNTER — Ambulatory Visit (HOSPITAL_COMMUNITY)
Admission: RE | Admit: 2022-05-06 | Discharge: 2022-05-06 | Disposition: A | Discharge status: Home / Self Care | Payer: BC Managed Care – PPO | Attending: General Surgery | Admitting: General Surgery

## 2022-05-06 ENCOUNTER — Other Ambulatory Visit: Payer: Self-pay

## 2022-05-06 ENCOUNTER — Encounter (HOSPITAL_COMMUNITY)
Admission: RE | Disposition: A | Discharge status: Home / Self Care | Payer: Self-pay | Source: Home / Self Care | Attending: General Surgery

## 2022-05-06 ENCOUNTER — Encounter (HOSPITAL_COMMUNITY): Payer: Self-pay | Admitting: General Surgery

## 2022-05-06 ENCOUNTER — Ambulatory Visit (HOSPITAL_COMMUNITY): Payer: BC Managed Care – PPO | Admitting: Anesthesiology

## 2022-05-06 DIAGNOSIS — K8010 Calculus of gallbladder with chronic cholecystitis without obstruction: Secondary | ICD-10-CM | POA: Insufficient documentation

## 2022-05-06 DIAGNOSIS — K8020 Calculus of gallbladder without cholecystitis without obstruction: Secondary | ICD-10-CM | POA: Diagnosis present

## 2022-05-06 DIAGNOSIS — E119 Type 2 diabetes mellitus without complications: Secondary | ICD-10-CM | POA: Diagnosis not present

## 2022-05-06 DIAGNOSIS — I10 Essential (primary) hypertension: Secondary | ICD-10-CM | POA: Diagnosis not present

## 2022-05-06 HISTORY — PX: CHOLECYSTECTOMY: SHX55

## 2022-05-06 SURGERY — LAPAROSCOPIC CHOLECYSTECTOMY
Anesthesia: General | Site: Abdomen

## 2022-05-06 MED ORDER — ONDANSETRON HCL 4 MG/2ML IJ SOLN
INTRAMUSCULAR | Status: AC
  Filled 2022-05-06: qty 2

## 2022-05-06 MED ORDER — SUGAMMADEX SODIUM 200 MG/2ML IV SOLN
INTRAVENOUS | Status: DC | PRN
  Administered 2022-05-06: 200 mg via INTRAVENOUS

## 2022-05-06 MED ORDER — FENTANYL CITRATE (PF) 250 MCG/5ML IJ SOLN
INTRAMUSCULAR | Status: DC | PRN
  Administered 2022-05-06 (×3): 50 ug via INTRAVENOUS
  Administered 2022-05-06: 100 ug via INTRAVENOUS

## 2022-05-06 MED ORDER — MIDAZOLAM HCL 2 MG/2ML IJ SOLN
INTRAMUSCULAR | Status: DC | PRN
  Administered 2022-05-06 (×2): 1 mg via INTRAVENOUS

## 2022-05-06 MED ORDER — FENTANYL CITRATE (PF) 100 MCG/2ML IJ SOLN
INTRAMUSCULAR | Status: AC
  Filled 2022-05-06: qty 2

## 2022-05-06 MED ORDER — ACETAMINOPHEN 500 MG PO TABS
1000.0000 mg | ORAL_TABLET | ORAL | Status: AC
  Administered 2022-05-06: 1000 mg via ORAL
  Filled 2022-05-06: qty 2

## 2022-05-06 MED ORDER — ORAL CARE MOUTH RINSE: 15.0000 mL | Freq: Once | OROMUCOSAL | Status: AC

## 2022-05-06 MED ORDER — PHENYLEPHRINE 80 MCG/ML (10ML) SYRINGE FOR IV PUSH (FOR BLOOD PRESSURE SUPPORT)
PREFILLED_SYRINGE | INTRAVENOUS | Status: AC
  Filled 2022-05-06: qty 10

## 2022-05-06 MED ORDER — AMISULPRIDE (ANTIEMETIC) 5 MG/2ML IV SOLN
INTRAVENOUS | Status: AC
  Filled 2022-05-06: qty 4

## 2022-05-06 MED ORDER — PROCHLORPERAZINE EDISYLATE 10 MG/2ML IJ SOLN
5.0000 mg | Freq: Four times a day (QID) | INTRAMUSCULAR | Status: DC | PRN
  Administered 2022-05-06: 5 mg via INTRAVENOUS

## 2022-05-06 MED ORDER — 0.9 % SODIUM CHLORIDE (POUR BTL) OPTIME
TOPICAL | Status: DC | PRN
  Administered 2022-05-06: 1000 mL

## 2022-05-06 MED ORDER — CHLORHEXIDINE GLUCONATE CLOTH 2 % EX PADS: 6.0000 | MEDICATED_PAD | Freq: Once | CUTANEOUS | Status: DC

## 2022-05-06 MED ORDER — PROCHLORPERAZINE EDISYLATE 10 MG/2ML IJ SOLN
INTRAMUSCULAR | Status: AC
  Filled 2022-05-06: qty 2

## 2022-05-06 MED ORDER — ROCURONIUM BROMIDE 10 MG/ML (PF) SYRINGE
PREFILLED_SYRINGE | INTRAVENOUS | Status: AC
  Filled 2022-05-06: qty 10

## 2022-05-06 MED ORDER — AMISULPRIDE (ANTIEMETIC) 5 MG/2ML IV SOLN
10.0000 mg | Freq: Once | INTRAVENOUS | Status: AC | PRN
  Administered 2022-05-06: 10 mg via INTRAVENOUS

## 2022-05-06 MED ORDER — OXYCODONE HCL 5 MG PO TABS: 5.0000 mg | ORAL_TABLET | ORAL | 0 refills | Status: AC | PRN

## 2022-05-06 MED ORDER — SODIUM CHLORIDE 0.9 % IR SOLN
Status: DC | PRN
  Administered 2022-05-06: 1000 mL

## 2022-05-06 MED ORDER — ONDANSETRON HCL 4 MG/2ML IJ SOLN: 4.0000 mg | Freq: Once | INTRAMUSCULAR | Status: DC | PRN

## 2022-05-06 MED ORDER — EPHEDRINE 5 MG/ML INJ
INTRAVENOUS | Status: AC
  Filled 2022-05-06: qty 5

## 2022-05-06 MED ORDER — ONDANSETRON HCL 4 MG PO TABS: 4.0000 mg | ORAL_TABLET | Freq: Every day | ORAL | 1 refills | Status: AC | PRN

## 2022-05-06 MED ORDER — METOCLOPRAMIDE HCL 5 MG/ML IJ SOLN
10.0000 mg | Freq: Four times a day (QID) | INTRAMUSCULAR | Status: DC | PRN
  Administered 2022-05-06: 10 mg via INTRAVENOUS

## 2022-05-06 MED ORDER — METOCLOPRAMIDE HCL 5 MG/ML IJ SOLN
INTRAMUSCULAR | Status: AC
  Filled 2022-05-06: qty 2

## 2022-05-06 MED ORDER — LACTATED RINGERS IV SOLN: INTRAVENOUS | Status: DC | PRN

## 2022-05-06 MED ORDER — DEXAMETHASONE SODIUM PHOSPHATE 10 MG/ML IJ SOLN
INTRAMUSCULAR | Status: AC
  Filled 2022-05-06: qty 1

## 2022-05-06 MED ORDER — ENSURE PRE-SURGERY PO LIQD: 296.0000 mL | Freq: Once | ORAL | Status: DC

## 2022-05-06 MED ORDER — CHLORHEXIDINE GLUCONATE 0.12 % MT SOLN: 15.0000 mL | Freq: Once | OROMUCOSAL | Status: AC

## 2022-05-06 MED ORDER — CHLORHEXIDINE GLUCONATE 0.12 % MT SOLN
OROMUCOSAL | Status: AC
  Administered 2022-05-06: 15 mL via OROMUCOSAL
  Filled 2022-05-06: qty 15

## 2022-05-06 MED ORDER — FENTANYL CITRATE (PF) 100 MCG/2ML IJ SOLN
25.0000 ug | INTRAMUSCULAR | Status: DC | PRN
  Administered 2022-05-06 (×2): 50 ug via INTRAVENOUS

## 2022-05-06 MED ORDER — BUPIVACAINE-EPINEPHRINE (PF) 0.25% -1:200000 IJ SOLN
INTRAMUSCULAR | Status: DC | PRN
  Administered 2022-05-06: 13 mL

## 2022-05-06 MED ORDER — PHENYLEPHRINE 80 MCG/ML (10ML) SYRINGE FOR IV PUSH (FOR BLOOD PRESSURE SUPPORT)
PREFILLED_SYRINGE | INTRAVENOUS | Status: DC | PRN
  Administered 2022-05-06 (×2): 80 ug via INTRAVENOUS

## 2022-05-06 MED ORDER — CEFAZOLIN SODIUM-DEXTROSE 2-4 GM/100ML-% IV SOLN
2.0000 g | INTRAVENOUS | Status: AC
  Administered 2022-05-06: 2 g via INTRAVENOUS
  Filled 2022-05-06: qty 100

## 2022-05-06 MED ORDER — ONDANSETRON HCL 4 MG/2ML IJ SOLN
INTRAMUSCULAR | Status: DC | PRN
  Administered 2022-05-06: 4 mg via INTRAVENOUS

## 2022-05-06 MED ORDER — PROPOFOL 10 MG/ML IV BOLUS
INTRAVENOUS | Status: DC | PRN
  Administered 2022-05-06: 130 mg via INTRAVENOUS

## 2022-05-06 MED ORDER — PROPOFOL 10 MG/ML IV BOLUS
INTRAVENOUS | Status: AC
  Filled 2022-05-06: qty 20

## 2022-05-06 MED ORDER — MIDAZOLAM HCL 2 MG/2ML IJ SOLN
INTRAMUSCULAR | Status: AC
  Filled 2022-05-06: qty 2

## 2022-05-06 MED ORDER — DEXAMETHASONE SODIUM PHOSPHATE 10 MG/ML IJ SOLN
INTRAMUSCULAR | Status: DC | PRN
  Administered 2022-05-06: 10 mg via INTRAVENOUS

## 2022-05-06 MED ORDER — OXYCODONE HCL 5 MG PO TABS: 5.0000 mg | ORAL_TABLET | Freq: Once | ORAL | Status: DC | PRN

## 2022-05-06 MED ORDER — FENTANYL CITRATE (PF) 250 MCG/5ML IJ SOLN
INTRAMUSCULAR | Status: AC
  Filled 2022-05-06: qty 5

## 2022-05-06 MED ORDER — SCOPOLAMINE 1 MG/3DAYS TD PT72
MEDICATED_PATCH | TRANSDERMAL | Status: DC
Start: 2022-05-06 — End: 2022-05-06
  Filled 2022-05-06: qty 1

## 2022-05-06 MED ORDER — KETOROLAC TROMETHAMINE 30 MG/ML IJ SOLN
INTRAMUSCULAR | Status: AC
  Filled 2022-05-06: qty 1

## 2022-05-06 MED ORDER — LIDOCAINE 2% (20 MG/ML) 5 ML SYRINGE
INTRAMUSCULAR | Status: DC | PRN
  Administered 2022-05-06: 60 mg via INTRAVENOUS

## 2022-05-06 MED ORDER — SCOPOLAMINE 1 MG/3DAYS TD PT72
1.0000 | MEDICATED_PATCH | TRANSDERMAL | Status: DC
  Administered 2022-05-06: 1.5 mg via TRANSDERMAL

## 2022-05-06 MED ORDER — LIDOCAINE 2% (20 MG/ML) 5 ML SYRINGE
INTRAMUSCULAR | Status: AC
  Filled 2022-05-06: qty 5

## 2022-05-06 MED ORDER — OXYCODONE HCL 5 MG/5ML PO SOLN: 5.0000 mg | Freq: Once | ORAL | Status: DC | PRN

## 2022-05-06 MED ORDER — LACTATED RINGERS IV SOLN: INTRAVENOUS | Status: DC

## 2022-05-06 MED ORDER — EPHEDRINE SULFATE-NACL 50-0.9 MG/10ML-% IV SOSY
PREFILLED_SYRINGE | INTRAVENOUS | Status: DC | PRN
  Administered 2022-05-06: 5 mg via INTRAVENOUS

## 2022-05-06 MED ORDER — ROCURONIUM BROMIDE 10 MG/ML (PF) SYRINGE
PREFILLED_SYRINGE | INTRAVENOUS | Status: DC | PRN
  Administered 2022-05-06: 60 mg via INTRAVENOUS

## 2022-05-06 MED ORDER — BUPIVACAINE-EPINEPHRINE (PF) 0.25% -1:200000 IJ SOLN
INTRAMUSCULAR | Status: AC
  Filled 2022-05-06: qty 30

## 2022-05-06 SURGICAL SUPPLY — 41 items
BAG COUNTER SPONGE SURGICOUNT (BAG) ×2 IMPLANT
CANISTER SUCT 3000ML PPV (MISCELLANEOUS) ×2 IMPLANT
CHLORAPREP W/TINT 26 (MISCELLANEOUS) ×2 IMPLANT
CLIP LIGATING HEMO O LOK GREEN (MISCELLANEOUS) ×2 IMPLANT
COVER SURGICAL LIGHT HANDLE (MISCELLANEOUS) ×2 IMPLANT
COVER TRANSDUCER ULTRASND (DRAPES) ×2 IMPLANT
DERMABOND ADVANCED (GAUZE/BANDAGES/DRESSINGS) ×1
DERMABOND ADVANCED .7 DNX12 (GAUZE/BANDAGES/DRESSINGS) ×1 IMPLANT
ELECT REM PT RETURN 9FT ADLT (ELECTROSURGICAL) ×2
ELECTRODE REM PT RTRN 9FT ADLT (ELECTROSURGICAL) ×1 IMPLANT
GLOVE BIO SURGEON STRL SZ7.5 (GLOVE) ×2 IMPLANT
GLOVE SURG SYN 7.5  E (GLOVE) ×1
GLOVE SURG SYN 7.5 E (GLOVE) ×1 IMPLANT
GLOVE SURG SYN 7.5 PF PI (GLOVE) ×1 IMPLANT
GOWN STRL REUS W/ TWL LRG LVL3 (GOWN DISPOSABLE) ×2 IMPLANT
GOWN STRL REUS W/ TWL XL LVL3 (GOWN DISPOSABLE) ×1 IMPLANT
GOWN STRL REUS W/TWL LRG LVL3 (GOWN DISPOSABLE) ×2
GOWN STRL REUS W/TWL XL LVL3 (GOWN DISPOSABLE) ×1
GRASPER SUT TROCAR 14GX15 (MISCELLANEOUS) ×2 IMPLANT
KIT BASIN OR (CUSTOM PROCEDURE TRAY) ×2 IMPLANT
KIT TURNOVER KIT B (KITS) ×2 IMPLANT
NDL INSUFFLATION 14GA 120MM (NEEDLE) ×1 IMPLANT
NEEDLE INSUFFLATION 14GA 120MM (NEEDLE) ×2 IMPLANT
NS IRRIG 1000ML POUR BTL (IV SOLUTION) ×2 IMPLANT
PAD ARMBOARD 7.5X6 YLW CONV (MISCELLANEOUS) ×2 IMPLANT
POUCH LAPAROSCOPIC INSTRUMENT (MISCELLANEOUS) ×2 IMPLANT
POUCH RETRIEVAL ECOSAC 10 (ENDOMECHANICALS) IMPLANT
POUCH RETRIEVAL ECOSAC 10MM (ENDOMECHANICALS)
SCISSORS LAP 5X35 DISP (ENDOMECHANICALS) ×2 IMPLANT
SET IRRIG TUBING LAPAROSCOPIC (IRRIGATION / IRRIGATOR) ×2 IMPLANT
SET TUBE SMOKE EVAC HIGH FLOW (TUBING) ×2 IMPLANT
SLEEVE ENDOPATH XCEL 5M (ENDOMECHANICALS) ×2 IMPLANT
SPECIMEN JAR SMALL (MISCELLANEOUS) ×2 IMPLANT
SUT MNCRL AB 4-0 PS2 18 (SUTURE) ×2 IMPLANT
TOWEL GREEN STERILE (TOWEL DISPOSABLE) ×2 IMPLANT
TOWEL GREEN STERILE FF (TOWEL DISPOSABLE) ×2 IMPLANT
TRAY LAPAROSCOPIC MC (CUSTOM PROCEDURE TRAY) ×2 IMPLANT
TROCAR XCEL NON-BLD 11X100MML (ENDOMECHANICALS) ×2 IMPLANT
TROCAR XCEL NON-BLD 5MMX100MML (ENDOMECHANICALS) ×2 IMPLANT
WARMER LAPAROSCOPE (MISCELLANEOUS) ×2 IMPLANT
WATER STERILE IRR 1000ML POUR (IV SOLUTION) ×2 IMPLANT

## 2022-05-06 NOTE — Transfer of Care (Signed)
Immediate Anesthesia Transfer of Care Note ? ?Patient: Theresa Mcmillan ? ?Procedure(s) Performed: LAPAROSCOPIC CHOLECYSTECTOMY (Abdomen) ? ?Patient Location: PACU ? ?Anesthesia Type:General ? ?Level of Consciousness: awake, alert  and oriented ? ?Airway & Oxygen Therapy: Patient Spontanous Breathing ? ?Post-op Assessment: Report given to RN and Post -op Vital signs reviewed and stable ? ?Post vital signs: Reviewed and stable ? ?Last Vitals:  ?Vitals Value Taken Time  ?BP 148/126 05/06/22 0931  ?Temp    ?Pulse 75 05/06/22 0933  ?Resp 16 05/06/22 0935  ?SpO2 93 % 05/06/22 0933  ?Vitals shown include unvalidated device data. ? ?Last Pain:  ?Vitals:  ? 05/06/22 0739  ?TempSrc:   ?PainSc: 0-No pain  ?   ? ?Patients Stated Pain Goal: 0 (05/06/22 0739) ? ?Complications: No notable events documented. ?

## 2022-05-06 NOTE — Discharge Instructions (Signed)
[facility] ______[facility] LAPAROSCOPIC SURGERY: POST OP INSTRUCTIONS Always review your discharge instruction sheet given to you by the facility where your surgery was performed. IF YOU HAVE DISABILITY OR FAMILY LEAVE FORMS, YOU MUST BRING THEM TO THE OFFICE FOR PROCESSING.   DO NOT GIVE THEM TO YOUR DOCTOR.  A prescription for pain medication may be given to you upon discharge.  Take your pain medication as prescribed, if needed.  If narcotic pain medicine is not needed, then you may take acetaminophen (Tylenol) or ibuprofen (Advil) as needed. Take your usually prescribed medications unless otherwise directed. If you need a refill on your pain medication, please contact your pharmacy.  They will contact our office to request authorization. Prescriptions will not be filled after 5pm or on week-ends. You should follow a light diet the first few days after arrival home, such as soup and crackers, etc.  Be sure to include lots of fluids daily. Most patients will experience some swelling and bruising in the area of the incisions.  Ice packs will help.  Swelling and bruising can take several days to resolve.  It is common to experience some constipation if taking pain medication after surgery.  Increasing fluid intake and taking a stool softener (such as Colace) will usually help or prevent this problem from occurring.  A mild laxative (Milk of Magnesia or Miralax) should be taken according to package instructions if there are no bowel movements after 48 hours. Unless discharge instructions indicate otherwise, you may remove your bandages 24-48 hours after surgery, and you may shower at that time.  You may have steri-strips (small skin tapes) in place directly over the incision.  These strips should be left on the skin for 7-10 days.  If your surgeon used skin glue on the incision, you may shower in 24 hours.  The glue will flake off over the next 2-3 weeks.  Any sutures or staples will be  removed at the office during your follow-up visit. ACTIVITIES:  You may resume regular (light) daily activities beginning the next day--such as daily self-care, walking, climbing stairs--gradually increasing activities as tolerated.  You may have sexual intercourse when it is comfortable.  Refrain from any heavy lifting or straining until approved by your doctor. You may drive when you are no longer taking prescription pain medication, you can comfortably wear a seatbelt, and you can safely maneuver your car and apply brakes. RETURN TO WORK:  __________________________________________________________ You should see your doctor in the office for a follow-up appointment approximately 2-3 weeks after your surgery.  Make sure that you call for this appointment within a day or two after you arrive home to insure a convenient appointment time. OTHER INSTRUCTIONS: __________________________________________________________________________________________________________________________ __________________________________________________________________________________________________________________________ WHEN TO CALL YOUR DOCTOR: Fever over 101.0 Inability to urinate Continued bleeding from incision. Increased pain, redness, or drainage from the incision. Increasing abdominal pain  The clinic staff is available to answer your questions during regular business hours.  Please don't hesitate to call and ask to speak to one of the nurses for clinical concerns.  If you have a medical emergency, go to the nearest emergency room or call 911.  A surgeon from [facility] is always on call at the hospital. [address], [address], [place], [place]  [address] ? [address] [address], [place], [place]   [address] [phone] ? [phone] ? FAX [phone] Web site: [email or web address]  

## 2022-05-06 NOTE — Op Note (Signed)
05/06/2022 ? ?9:09 AM ? ?PATIENT:  Theresa Mcmillan  61 y.o. female ? ?PRE-OPERATIVE DIAGNOSIS:  GALLSTONES ? ?POST-OPERATIVE DIAGNOSIS:  GALLSTONES ? ?PROCEDURE:  Procedure(s): ?LAPAROSCOPIC CHOLECYSTECTOMY (N/A) ? ?SURGEON:  Surgeon(s) and Role: ?   Ralene Ok, MD - Primary ? ?ASSISTANTS: Ewell Poe, RNFA  ? ?ANESTHESIA:   local and general ? ?EBL:  minimal  ? ?BLOOD ADMINISTERED:none ? ?DRAINS: none  ? ?LOCAL MEDICATIONS USED:  BUPIVICAINE  ? ?SPECIMEN:  Gallbladder ? ?DISPOSITION OF SPECIMEN:  PATHOLOGY ? ?COUNTS:  YES ? ?TOURNIQUET:  * No tourniquets in log * ? ?DICTATION: .Dragon Dictation ? ? ?EBL: <5cc  ? ?Complications: none  ? ?Counts: reported as correct x 2  ? ?Findings:chronic inflammation of the gallbladder and gallstones ? ?Indications for procedure: Pt is a 71F with RUQ pain and seen to have gallstones.  ? ?Details of the procedure: The patient was taken to the operating and placed in the supine position with bilateral SCDs in place. A time out was called and all facts were verified. A pneumoperitoneum was obtained via A Veress needle technique to a pressure of 71m of mercury. A 556mtrochar was then placed in the right upper quadrant under visualization, and there were no injuries to any abdominal organs. A 11 mm port was then placed in the umbilical region after infiltrating with local anesthesia under direct visualization. A second epigastric port was placed under direct visualization.  ? ?The gallbladder was identified and retracted, the peritoneum was then sharply dissected from the gallbladder and this dissection was carried down to Calot's triangle. The cystic duct was identified and dissected circumferentially and seen going into the gallbladder 360?. Marland KitchenThe cystic artery was dissected away from the surrounding tissues.   The critical angle was obtained.   ? ?2 clips were placed proximally one distally and the cystic duct transected. The cystic artery was identified and 2 clips placed  proximally and one distally and transected. We then proceeded to remove the gallbladder off the hepatic fossa with Bovie cautery. A retrieval bag was then placed in the abdomen and gallbladder placed in the bag. The hepatic fossa was then reexamined and hemostasis was achieved with Bovie cautery and was excellent at this portion of the case. The subhepatic fossa and perihepatic fossa was then irrigated until the effluent was clear. The specimen bag and specimen were removed from the abdominal cavity. ? ?The 11 mm trocar fascia was reapproximated with the Endo Close #1 Vicryl x3. The pneumoperitoneum was evacuated and all trochars removed under direct visulalization. The skin was then closed with 4-0 Monocryl and the skin dressed with Dermabond. The patient was awaken from general anesthesia and taken to the recovery room in stable condition. ? ? ?PLAN OF CARE: Discharge to home after PACU ? ?PATIENT DISPOSITION:  PACU - hemodynamically stable. ?  ?Delay start of Pharmacological VTE agent (>24hrs) due to surgical blood loss or risk of bleeding: not applicable ? ?

## 2022-05-06 NOTE — Interval H&P Note (Signed)
History and Physical Interval Note: ? ?05/06/2022 ?8:13 AM ? ?Theresa Mcmillan  has presented today for surgery, with the diagnosis of GALLSTONES.  The various methods of treatment have been discussed with the patient and family. After consideration of risks, benefits and other options for treatment, the patient has consented to  Procedure(s): ?LAPAROSCOPIC CHOLECYSTECTOMY (N/A) as a surgical intervention.  The patient's history has been reviewed, patient examined, no change in status, stable for surgery.  I have reviewed the patient's chart and labs.  Questions were answered to the patient's satisfaction.   ? ? ?Ralene Ok ? ? ?

## 2022-05-06 NOTE — Anesthesia Procedure Notes (Signed)
Procedure Name: Intubation ?Date/Time: 05/06/2022 8:30 AM ?Performed by: Harden Mo, CRNA ?Pre-anesthesia Checklist: Patient identified, Emergency Drugs available, Suction available and Patient being monitored ?Patient Re-evaluated:Patient Re-evaluated prior to induction ?Oxygen Delivery Method: Circle System Utilized ?Preoxygenation: Pre-oxygenation with 100% oxygen ?Induction Type: IV induction ?Ventilation: Mask ventilation without difficulty ?Laryngoscope Size: Sabra Heck and 2 ?Grade View: Grade I ?Tube type: Oral ?Tube size: 7.0 mm ?Number of attempts: 1 ?Airway Equipment and Method: Stylet and Oral airway ?Placement Confirmation: ETT inserted through vocal cords under direct vision, positive ETCO2 and breath sounds checked- equal and bilateral ?Secured at: 21 cm ?Tube secured with: Tape ?Dental Injury: Teeth and Oropharynx as per pre-operative assessment  ? ? ? ? ?

## 2022-05-07 ENCOUNTER — Encounter (HOSPITAL_COMMUNITY): Payer: Self-pay | Admitting: General Surgery

## 2022-05-07 LAB — SURGICAL PATHOLOGY

## 2022-05-07 NOTE — Anesthesia Postprocedure Evaluation (Signed)
Anesthesia Post Note ? ?Patient: Theresa Mcmillan ? ?Procedure(s) Performed: LAPAROSCOPIC CHOLECYSTECTOMY (Abdomen) ? ?  ? ?Patient location during evaluation: PACU ?Anesthesia Type: General ?Level of consciousness: awake and alert ?Pain management: pain level controlled ?Vital Signs Assessment: post-procedure vital signs reviewed and stable ?Respiratory status: spontaneous breathing, nonlabored ventilation, respiratory function stable and patient connected to nasal cannula oxygen ?Cardiovascular status: blood pressure returned to baseline and stable ?Postop Assessment: no apparent nausea or vomiting ?Anesthetic complications: no ? ? ?No notable events documented. ? ?Last Vitals:  ?Vitals:  ? 05/06/22 1000 05/06/22 1009  ?BP: 138/67 126/72  ?Pulse: (!) 58 60  ?Resp: 13 18  ?Temp:  36.6 ?C  ?SpO2: 94% 94%  ?  ?Last Pain:  ?Vitals:  ? 05/06/22 1009  ?TempSrc:   ?PainSc: 0-No pain  ? ? ?  ?  ?  ?  ?  ?  ? ?[name] ? ? ? ? ?

## 2022-05-15 ENCOUNTER — Other Ambulatory Visit: Payer: Self-pay | Admitting: Otolaryngology

## 2022-05-15 DIAGNOSIS — R591 Generalized enlarged lymph nodes: Secondary | ICD-10-CM

## 2022-05-19 ENCOUNTER — Other Ambulatory Visit: Payer: BC Managed Care – PPO

## 2022-05-20 ENCOUNTER — Ambulatory Visit
Admission: RE | Admit: 2022-05-20 | Discharge: 2022-05-20 | Disposition: A | Discharge status: Home / Self Care | Payer: BC Managed Care – PPO | Source: Ambulatory Visit | Attending: Otolaryngology | Admitting: Otolaryngology

## 2022-05-20 DIAGNOSIS — R591 Generalized enlarged lymph nodes: Secondary | ICD-10-CM

## 2022-06-07 IMAGING — MG MM DIGITAL SCREENING BILAT W/ TOMO AND CAD
8 series · 8 of 24 positions shown · non-contrast
Comparison: Previous exam(s).

CLINICAL DATA: Screening.

EXAM:
DIGITAL SCREENING BILATERAL MAMMOGRAM WITH TOMOSYNTHESIS AND CAD
TECHNIQUE: Bilateral screening digital craniocaudal and mediolateral oblique
mammograms were obtained. Bilateral screening digital breast
tomosynthesis was performed. The images were evaluated with
computer-aided detection.

[L CC synth-2D]
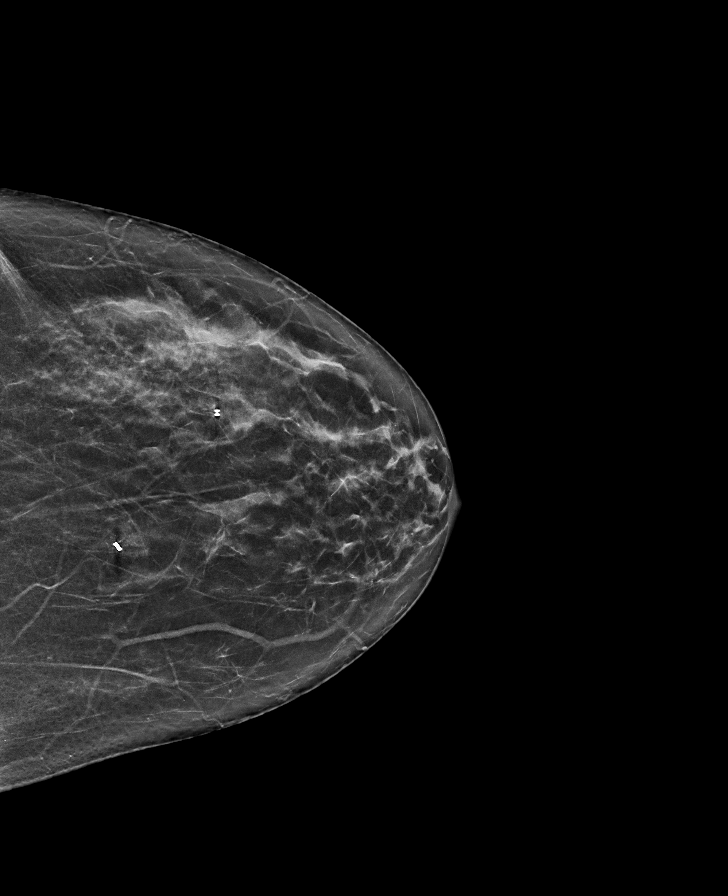

[L MLO synth-2D]
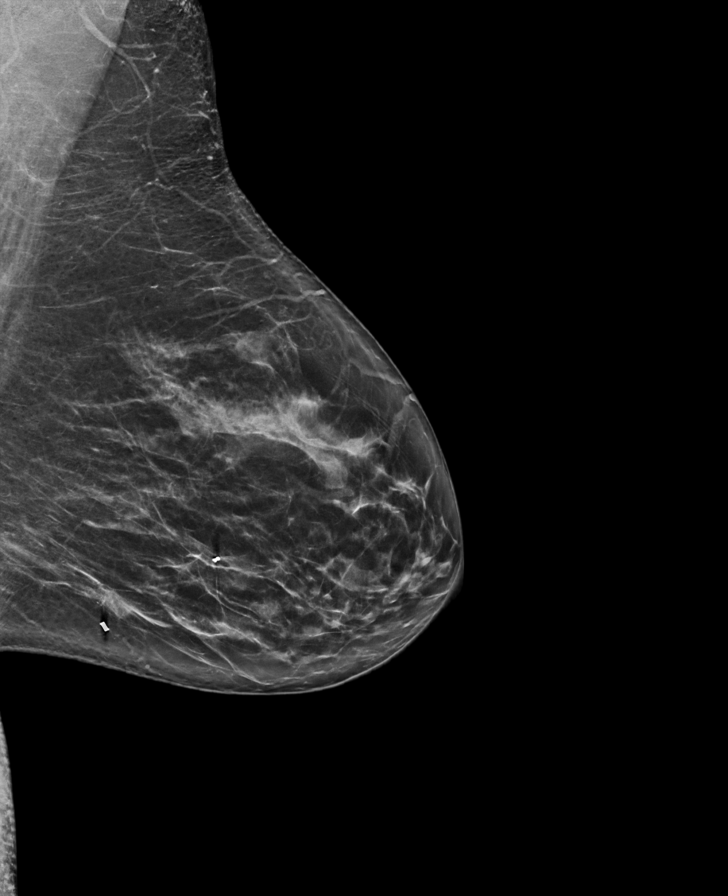

[R CC synth-2D]
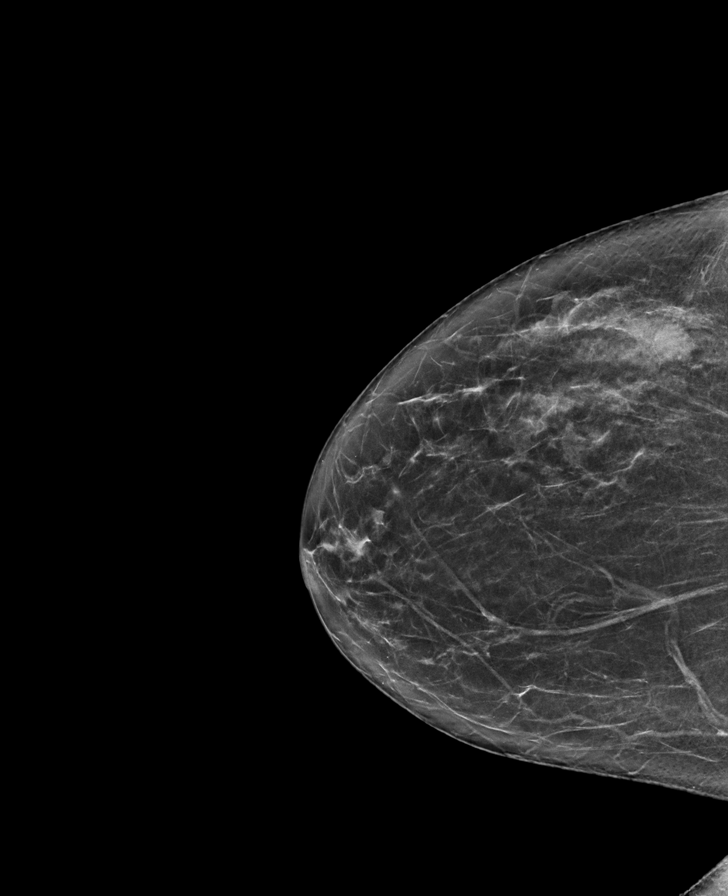

[R MLO synth-2D]
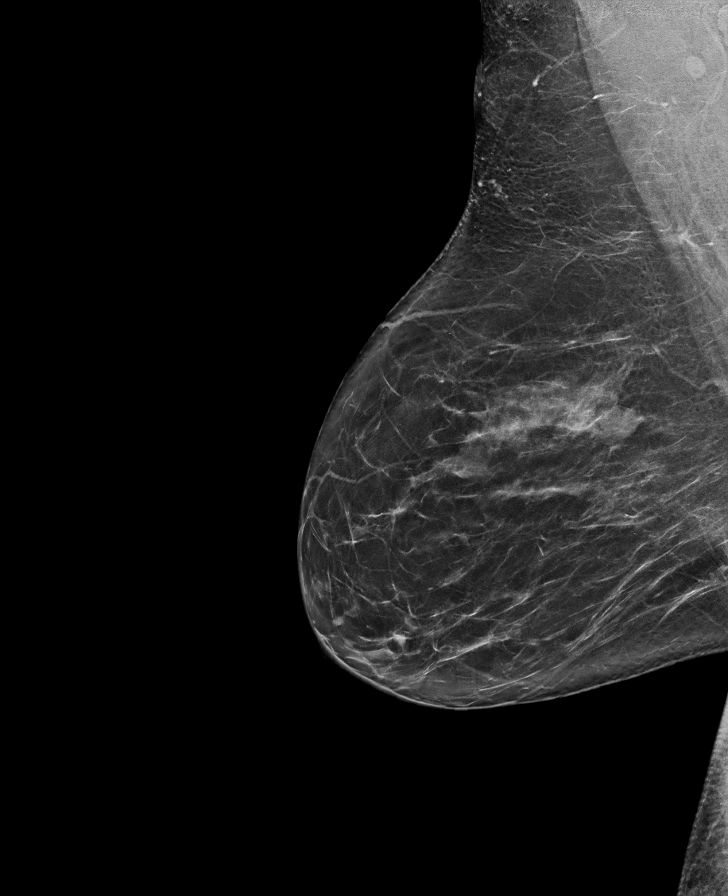

[L MLO tomo · tomo slice 37/74.0]
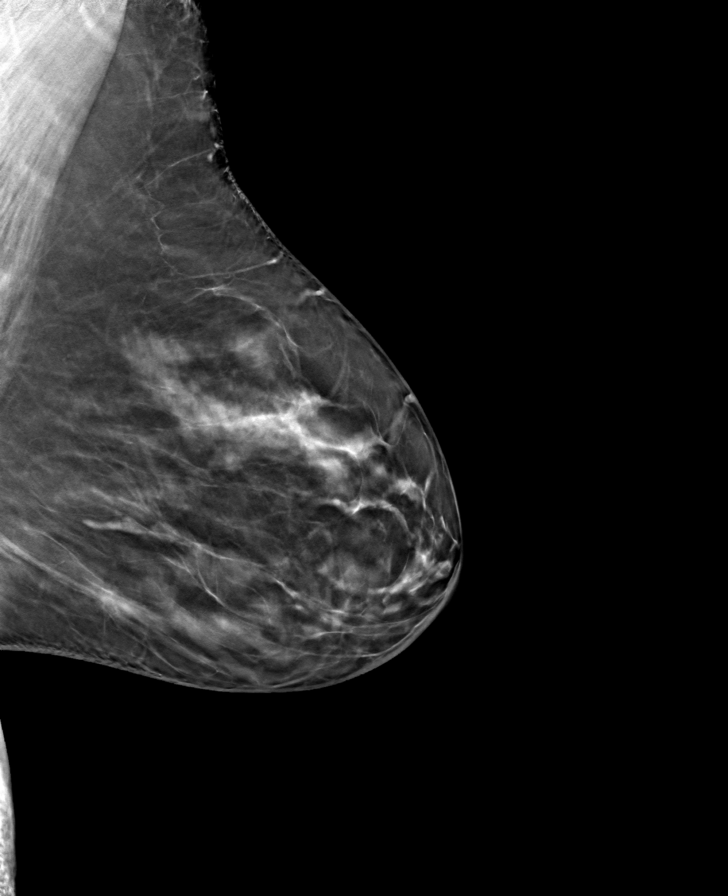

[L CC tomo · tomo slice 33/65.0]
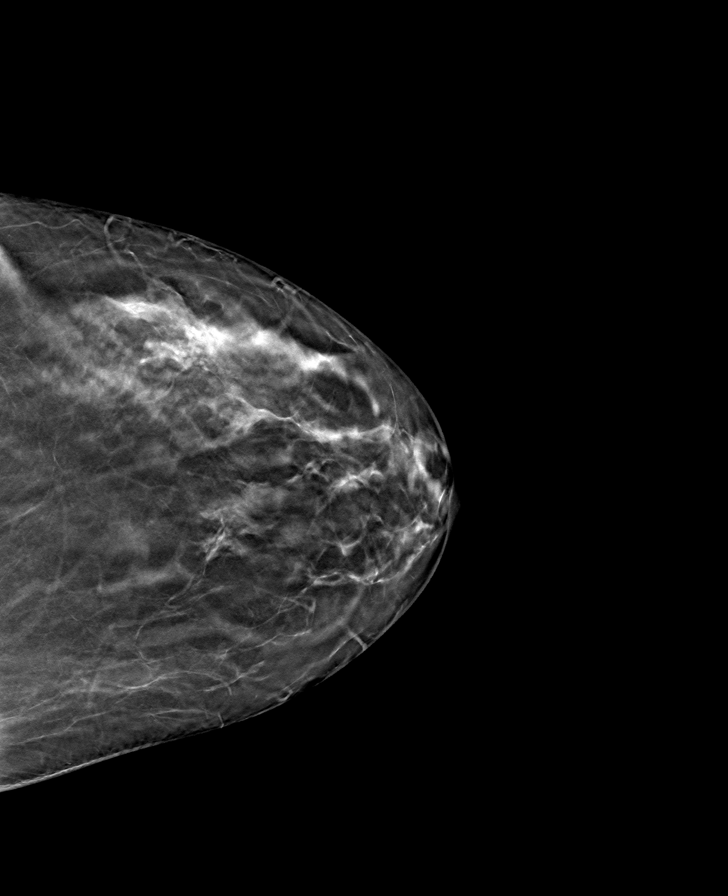

[R CC tomo · tomo slice 33/66.0]
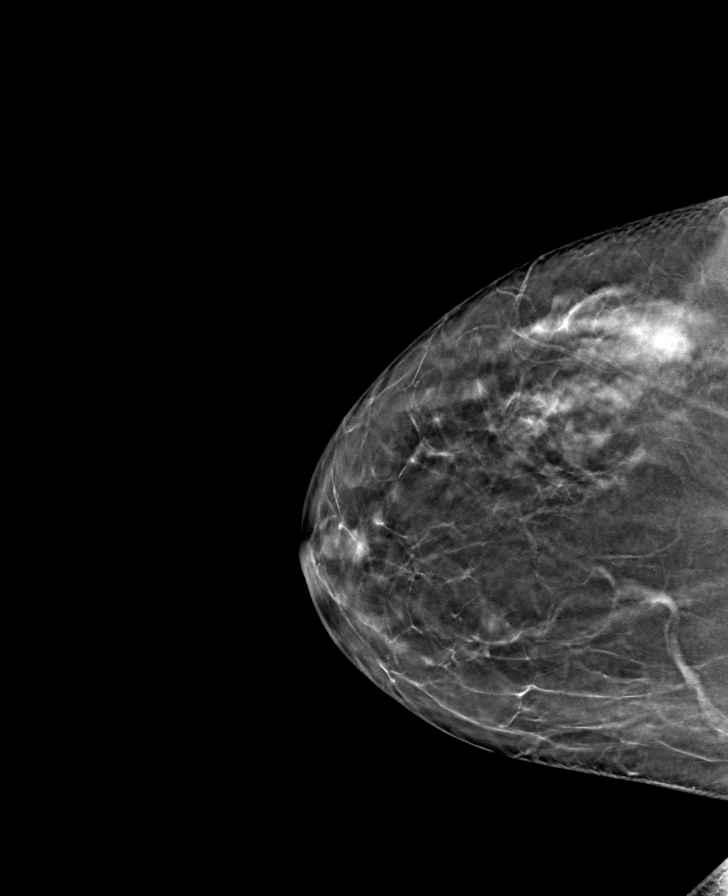

[R MLO tomo · tomo slice 39/78.0]
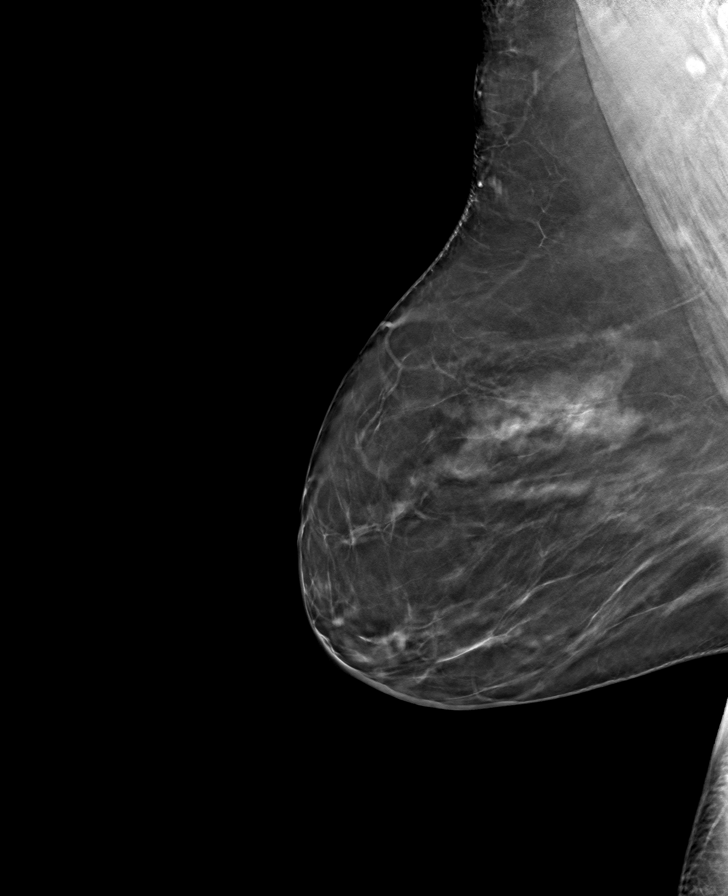

[8 of 24 positions shown; findings below may reference images not displayed]

ACR Breast Density Category b: There are scattered areas of
fibroglandular density.
FINDINGS: There are no findings suspicious for malignancy.
IMPRESSION: No mammographic evidence of malignancy. A result letter of this
screening mammogram will be mailed directly to the patient.

RECOMMENDATION:
Screening mammogram in one year. (Code:51-O-LD2)

BI-RADS CATEGORY  1: Negative.

## 2022-07-14 IMAGING — CT CT ABD-PELV W/ CM
2 of 5 series · 16 of 46 positions shown, 18 images · IV contrast (APPLIED)
Comparison: None.

CLINICAL DATA: Right upper quadrant abdominal pain

EXAM:
CT ABDOMEN AND PELVIS WITH CONTRAST
TECHNIQUE: Multidetector CT imaging of the abdomen and pelvis was performed
using the standard protocol following bolus administration of
intravenous contrast.

[Series 2: routine abd/pel with · axial · 0.77mm/px · z∈[-1005,-565]mm · 13 of 100 slices shown, 15 images]
[im 6/100  soft-tissue]
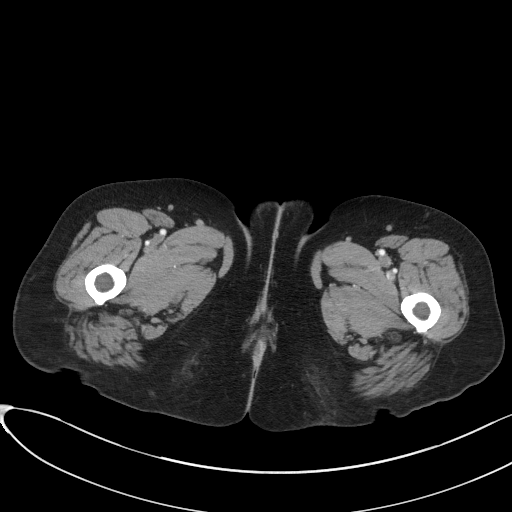
[im 6/100  bone]
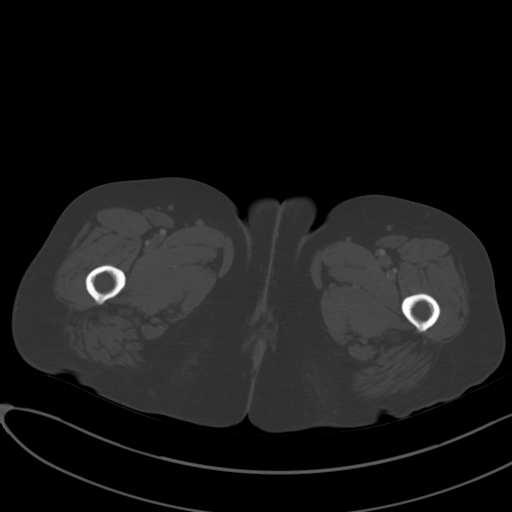
[im 16/100  soft-tissue]
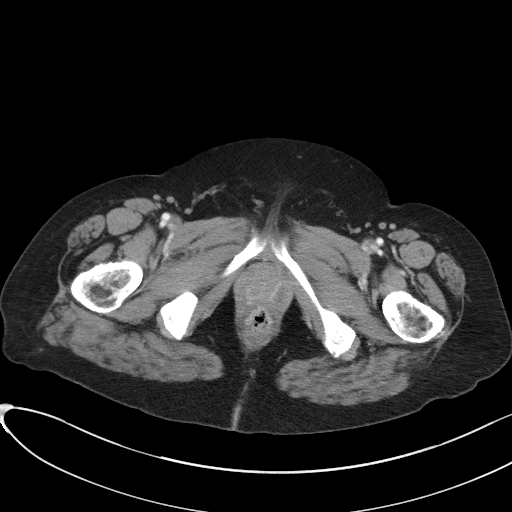
[im 21/100  soft-tissue]
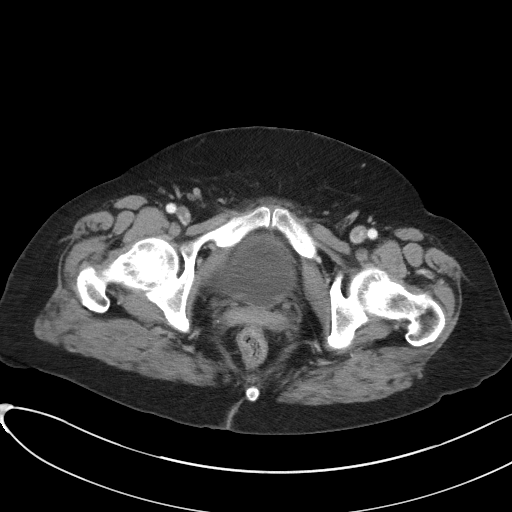
[im 27/100  soft-tissue]
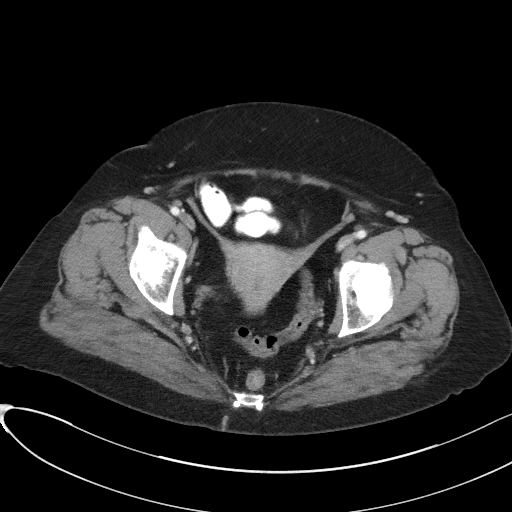
[im 37/100  soft-tissue]
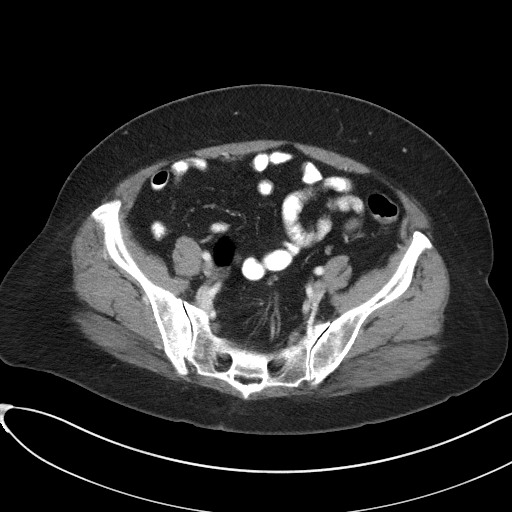
[im 42/100  soft-tissue]
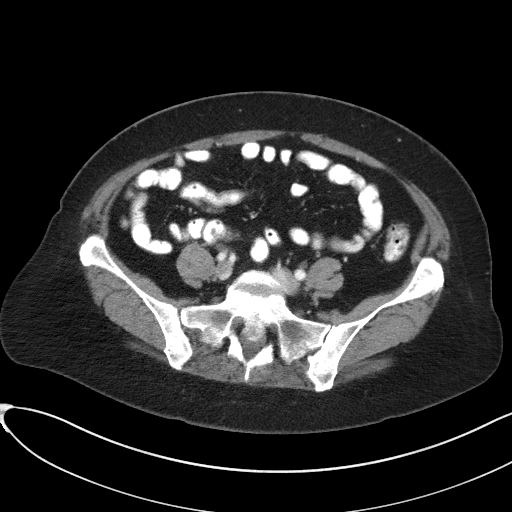
[im 53/100  soft-tissue]
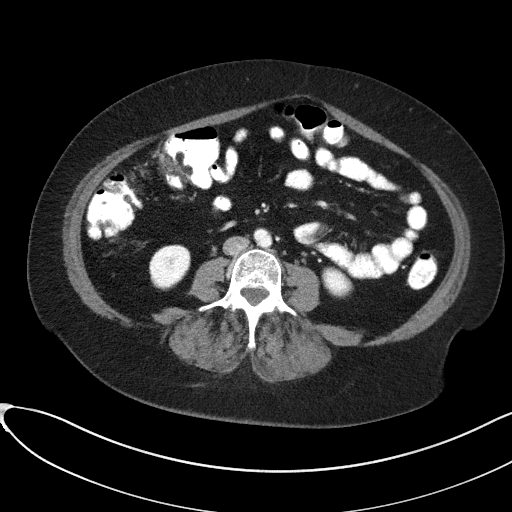
[im 58/100  soft-tissue]
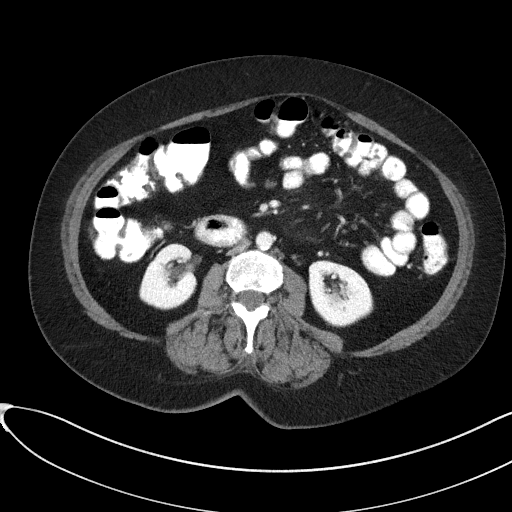
[im 63/100  soft-tissue]
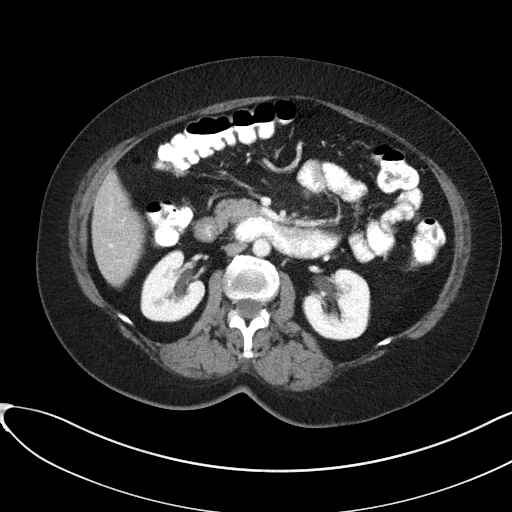
[im 63/100  bone]
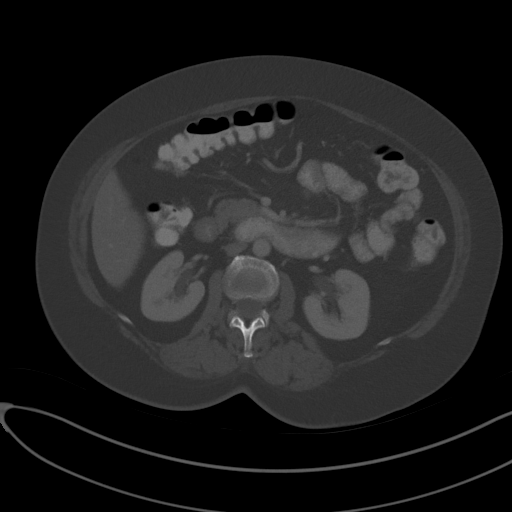
[im 73/100  soft-tissue]
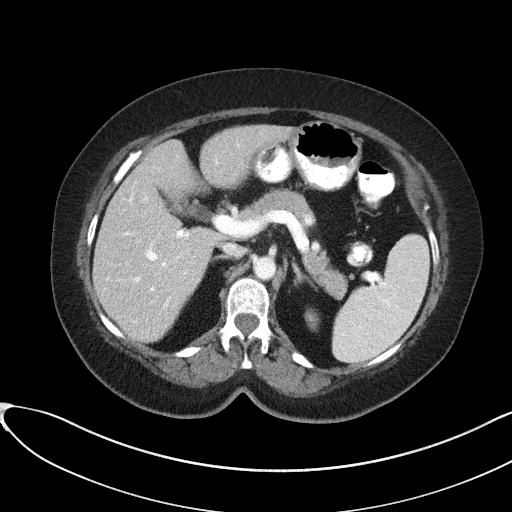
[im 79/100  soft-tissue]
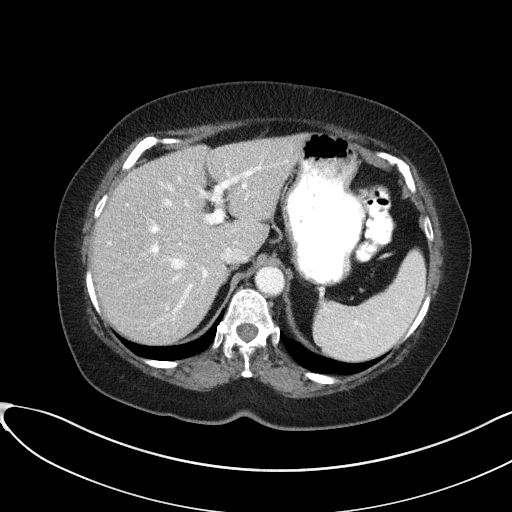
[im 84/100  soft-tissue]
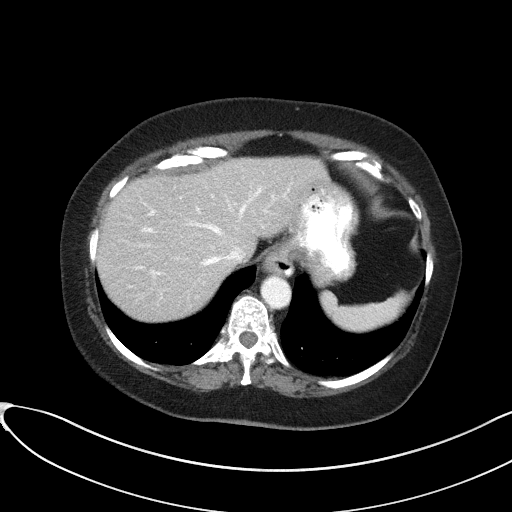
[im 94/100  soft-tissue]
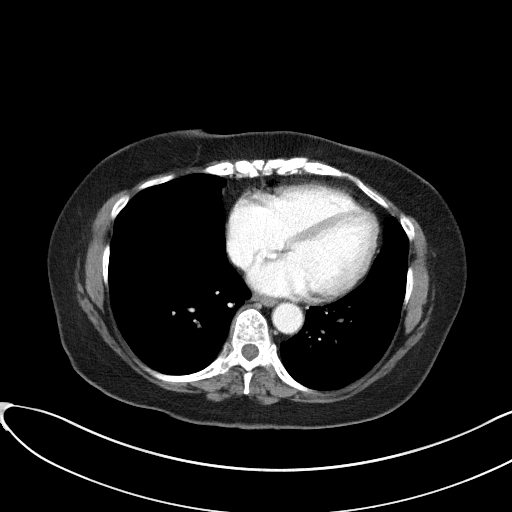

[Series 6: coronal st · coronal · 0.80mm/px · 3 of 99 slices shown]
[im 33/99  soft-tissue]
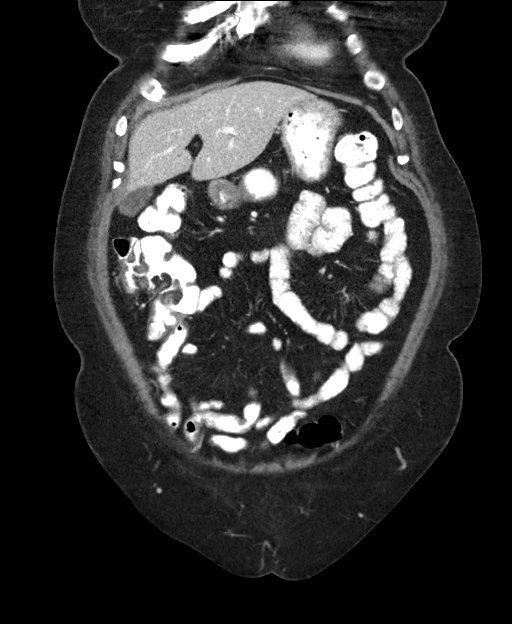
[im 44/99  soft-tissue]
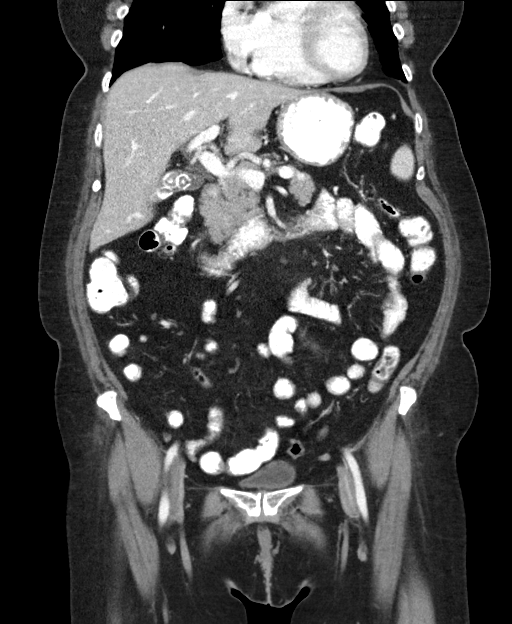
[im 55/99  soft-tissue]
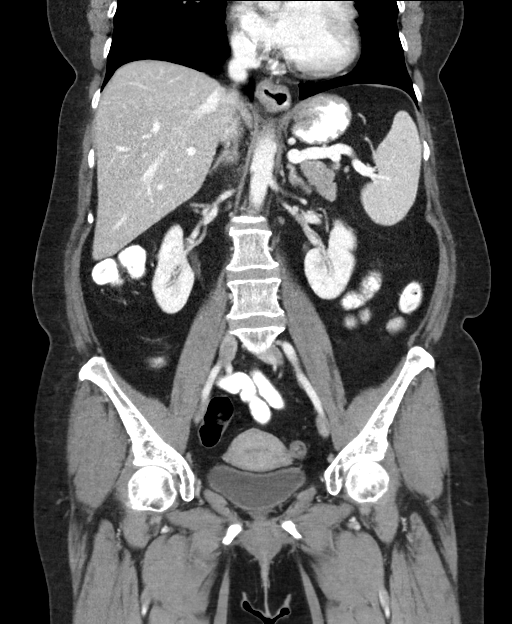

[16 of 46 positions shown; findings below may reference images not displayed]

RADIATION DOSE REDUCTION: This exam was performed according to the
departmental dose-optimization program which includes automated
exposure control, adjustment of the mA and/or kV according to
patient size and/or use of iterative reconstruction technique.

CONTRAST:  80mL OMNIPAQUE IOHEXOL 300 MG/ML  SOLN

The patient experienced hives along the face in shoulders after IV
contrast administration. The patient was evaluated by Dr. Vhora, and
observed for 45 minutes. The patient was reportedly instructed to
take Benadryl.
FINDINGS: Lower chest: Unremarkable

Hepatobiliary: Multiple gallstones measuring up to 1.6 cm in
diameter. No gallbladder wall thickening or pericholecystic fluid.
Liver appears otherwise unremarkable. No biliary dilatation.

Pancreas: Unremarkable

Spleen: Unremarkable

Adrenals/Urinary Tract: 0.5 cm hypodense lesion in the right kidney
lower pole, image 58 series 6, technically too small to characterize
although statistically highly likely to be benign. In general, such
lesions are not felt to warrant further workup unless there is a
specific renal clinical concern.

Adrenal glands appear unremarkable. The urinary bladder appears
normal.

Stomach/Bowel: Unremarkable.  Normal appendix.

Vascular/Lymphatic: Mild abdominal aortic atherosclerotic
calcification.

Reproductive: Unremarkable

Other: No supplemental non-categorized findings.

Musculoskeletal: Unremarkable
IMPRESSION: 1. Cholelithiasis. No gallbladder wall thickening or pericholecystic
fluid identified.
2.  Aortic Atherosclerosis ([id number]).

## 2022-09-01 IMAGING — US US SOFT TISSUE HEAD/NECK
1 series · 8 of 8 positions shown · non-contrast
Comparison: None Available.

CLINICAL DATA: Right lymphadenopathy

EXAM:
ULTRASOUND OF HEAD/NECK SOFT TISSUES
TECHNIQUE: Ultrasound examination of the head and neck soft tissues was
performed in the area of clinical concern.

[Series 1: us soft tissue head/neck · 0.06mm/px · 8 of 8 slices shown]
[im 1/8]
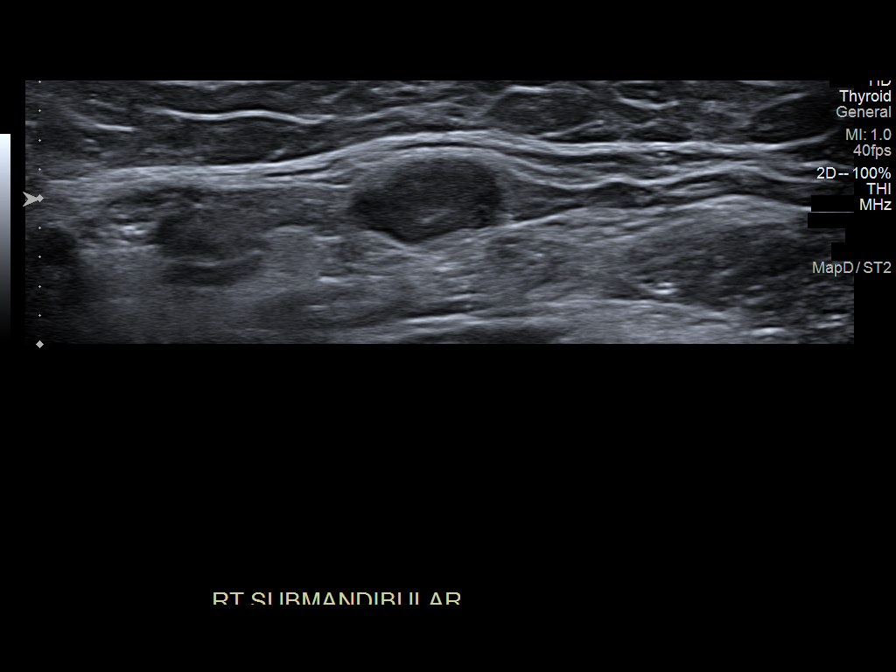
[im 2/8]
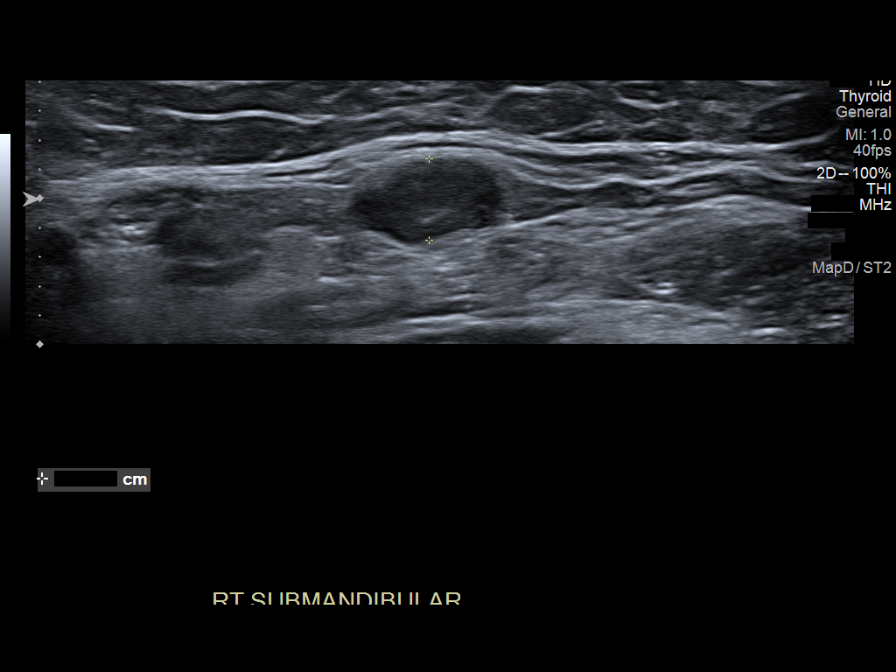
[im 3/8]
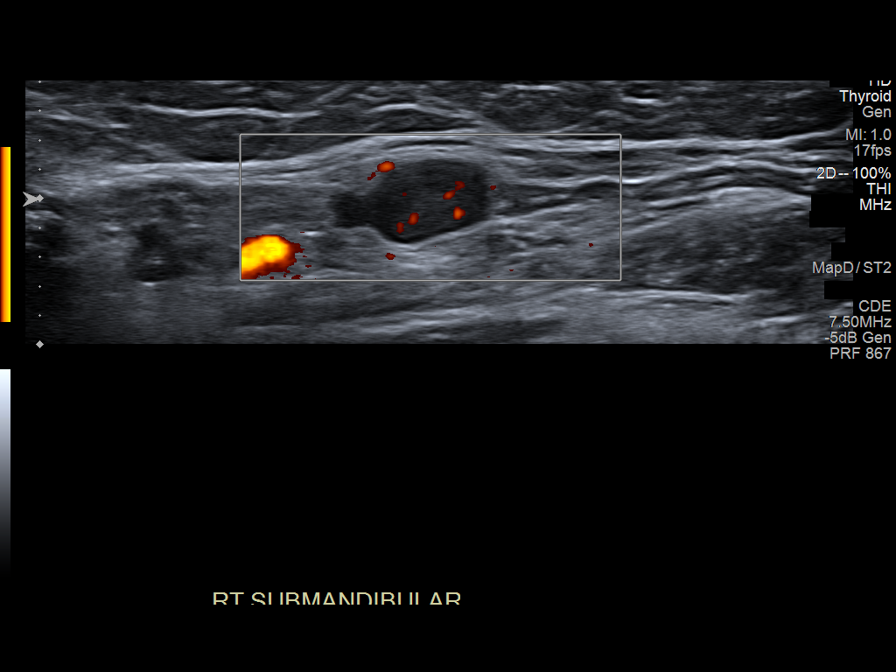
[im 4/8]
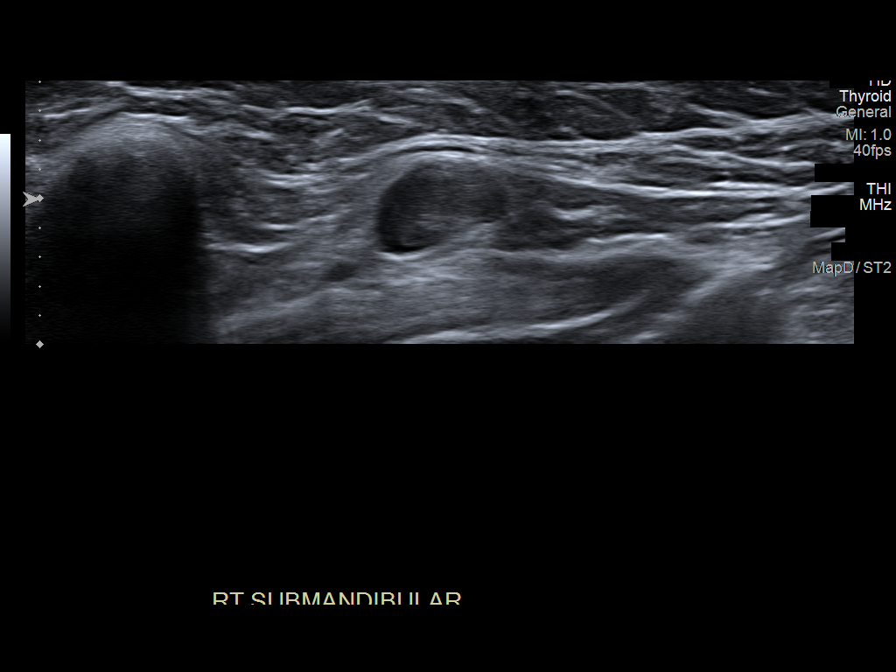
[im 5/8]
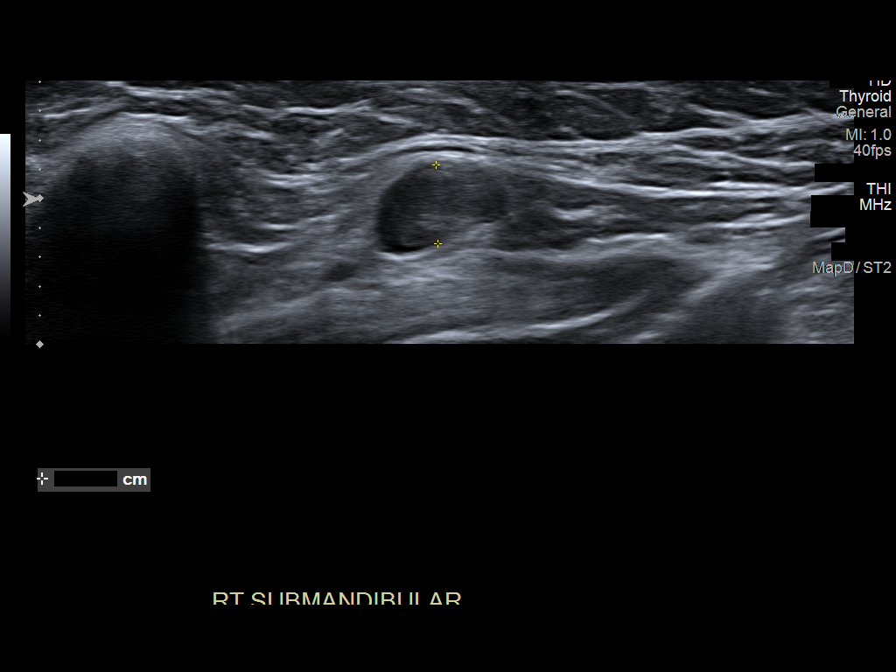
[im 6/8]
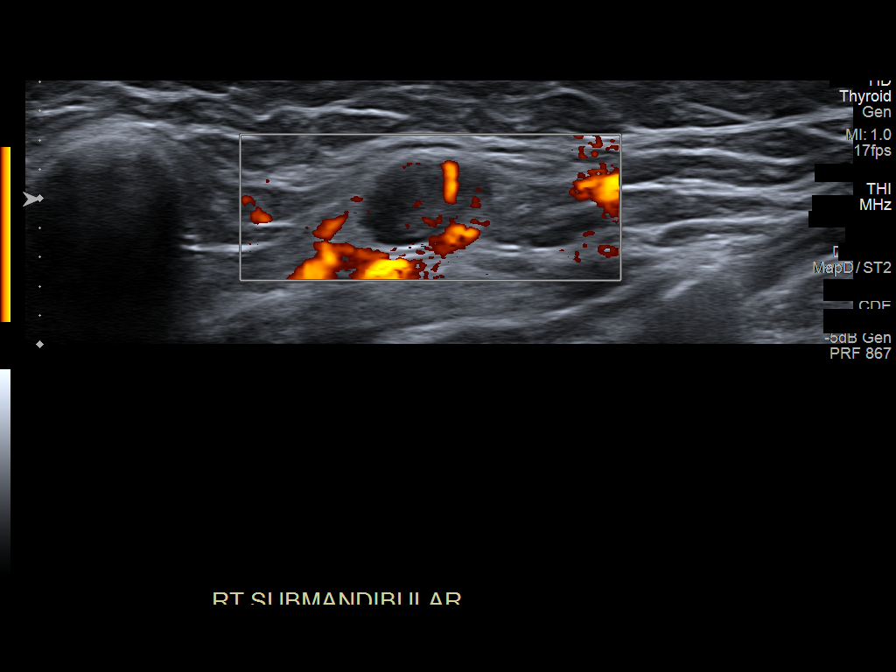
[im 7/8]
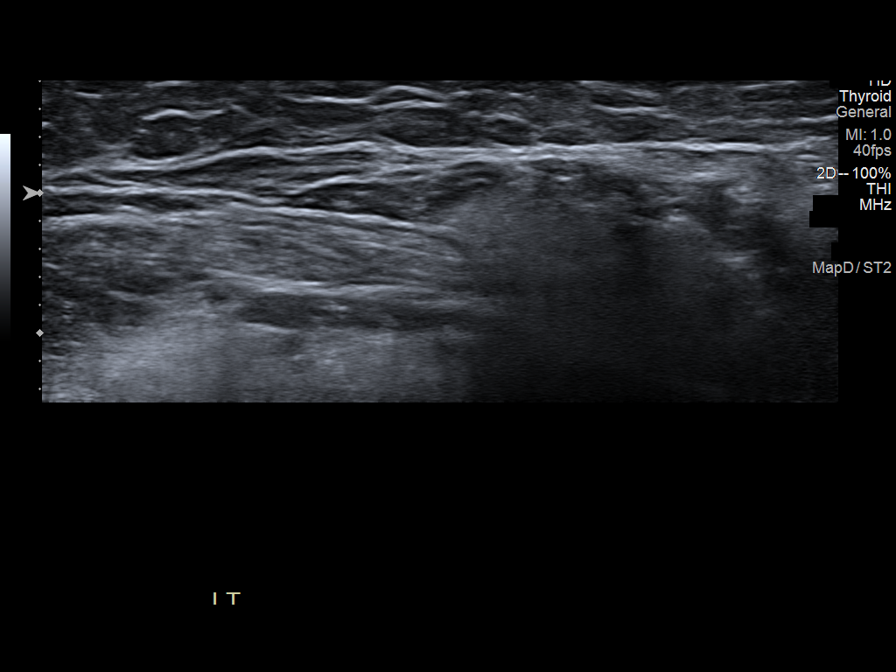
[im 8/8]
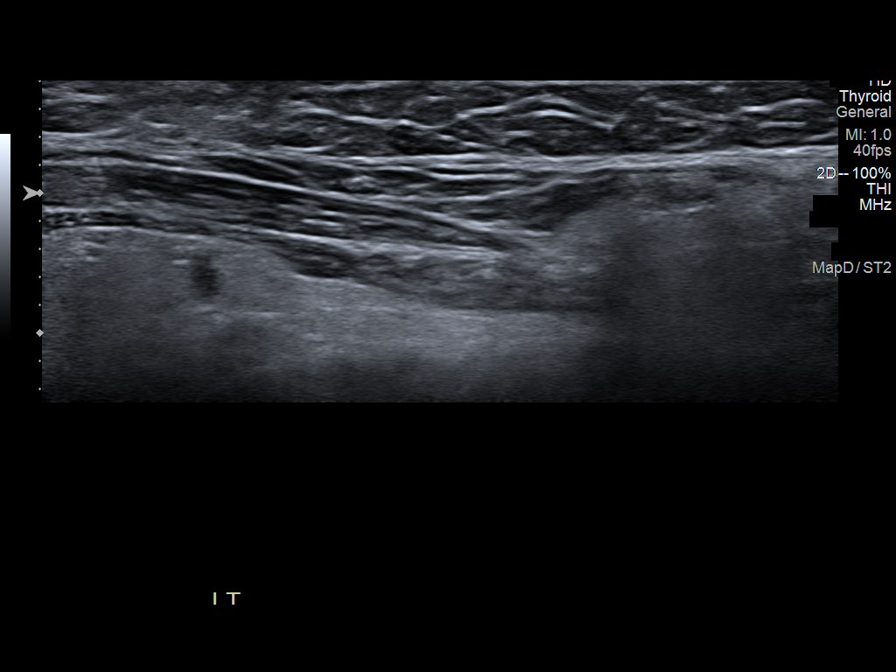

[8 of 8 positions shown; findings below may reference images not displayed]

FINDINGS: In the area palpable concern, in the right submandibular region,
there is a lymph node, which measures up to 6 mm in short axis,
within normal limits. This lymph node retains its reniform
appearance and demonstrates a fatty hilum.
IMPRESSION: In the right submandibular region, the palpated abnormality
corresponds to a lymph node with normal morphology.

## 2022-12-09 ENCOUNTER — Other Ambulatory Visit: Payer: Self-pay | Admitting: Obstetrics and Gynecology

## 2022-12-09 DIAGNOSIS — Z1231 Encounter for screening mammogram for malignant neoplasm of breast: Secondary | ICD-10-CM

## 2023-02-10 ENCOUNTER — Other Ambulatory Visit: Payer: Self-pay | Admitting: Otolaryngology

## 2023-02-10 DIAGNOSIS — R220 Localized swelling, mass and lump, head: Secondary | ICD-10-CM

## 2023-02-18 ENCOUNTER — Ambulatory Visit
Admission: RE | Admit: 2023-02-18 | Discharge: 2023-02-18 | Disposition: A | Discharge status: Home / Self Care | Payer: BC Managed Care – PPO | Source: Ambulatory Visit | Attending: Otolaryngology | Admitting: Otolaryngology

## 2023-02-18 DIAGNOSIS — R220 Localized swelling, mass and lump, head: Secondary | ICD-10-CM

## 2023-02-18 MED ORDER — IOPAMIDOL (ISOVUE-370) INJECTION 76%
75.0000 mL | Freq: Once | INTRAVENOUS | Status: AC | PRN
  Administered 2023-02-18: 75 mL via INTRAVENOUS

## 2023-02-18 NOTE — Progress Notes (Signed)
Patient ID: Theresa Mcmillan, female   DOB: 1961-10-11, 62 y.o.   MRN: [id number]  Pt in for CT w/ contrast. Pt took 13hr prep as prescribed however had 2 hives (L cheek and midline upper back) post contrast injection. Pt denied any additional symptoms. VS stable. Dr. Laurence Ferrari aware and in to assess patient. Pt monitored for 48mns with no progression of symptoms. Pt discharged home.

## 2023-02-24 ENCOUNTER — Ambulatory Visit
Admission: RE | Admit: 2023-02-24 | Discharge: 2023-02-24 | Disposition: A | Discharge status: Home / Self Care | Payer: BC Managed Care – PPO | Source: Ambulatory Visit | Attending: Obstetrics and Gynecology | Admitting: Obstetrics and Gynecology

## 2023-02-24 DIAGNOSIS — Z1231 Encounter for screening mammogram for malignant neoplasm of breast: Secondary | ICD-10-CM | POA: Diagnosis present

## 2023-02-26 ENCOUNTER — Other Ambulatory Visit: Payer: Self-pay | Admitting: Obstetrics and Gynecology

## 2023-02-26 DIAGNOSIS — N63 Unspecified lump in unspecified breast: Secondary | ICD-10-CM

## 2023-02-26 DIAGNOSIS — R928 Other abnormal and inconclusive findings on diagnostic imaging of breast: Secondary | ICD-10-CM

## 2023-03-01 ENCOUNTER — Ambulatory Visit
Admission: RE | Admit: 2023-03-01 | Discharge: 2023-03-01 | Disposition: A | Discharge status: Home / Self Care | Payer: BC Managed Care – PPO | Source: Ambulatory Visit | Attending: Obstetrics and Gynecology | Admitting: Obstetrics and Gynecology

## 2023-03-01 DIAGNOSIS — N63 Unspecified lump in unspecified breast: Secondary | ICD-10-CM | POA: Diagnosis present

## 2023-03-01 DIAGNOSIS — R928 Other abnormal and inconclusive findings on diagnostic imaging of breast: Secondary | ICD-10-CM | POA: Diagnosis not present

## 2023-03-03 ENCOUNTER — Other Ambulatory Visit: Payer: Self-pay | Admitting: Obstetrics and Gynecology

## 2023-03-03 DIAGNOSIS — N63 Unspecified lump in unspecified breast: Secondary | ICD-10-CM

## 2023-03-03 DIAGNOSIS — R928 Other abnormal and inconclusive findings on diagnostic imaging of breast: Secondary | ICD-10-CM

## 2023-03-09 ENCOUNTER — Ambulatory Visit
Admission: RE | Admit: 2023-03-09 | Discharge: 2023-03-09 | Disposition: A | Discharge status: Home / Self Care | Payer: BC Managed Care – PPO | Source: Ambulatory Visit | Attending: Obstetrics and Gynecology | Admitting: Obstetrics and Gynecology

## 2023-03-09 DIAGNOSIS — R928 Other abnormal and inconclusive findings on diagnostic imaging of breast: Secondary | ICD-10-CM | POA: Insufficient documentation

## 2023-03-09 DIAGNOSIS — N6001 Solitary cyst of right breast: Secondary | ICD-10-CM | POA: Diagnosis not present

## 2023-03-09 DIAGNOSIS — N63 Unspecified lump in unspecified breast: Secondary | ICD-10-CM

## 2023-03-09 MED ORDER — LIDOCAINE HCL (PF) 1 % IJ SOLN
1.0000 mL | Freq: Once | INTRAMUSCULAR | Status: AC
  Administered 2023-03-09: 1 mL via INTRADERMAL
  Filled 2023-03-09: qty 2

## 2023-03-09 MED ORDER — LIDOCAINE-EPINEPHRINE 1 %-1:100000 IJ SOLN
6.0000 mL | Freq: Once | INTRAMUSCULAR | Status: AC
  Administered 2023-03-09: 6 mL via INTRADERMAL
  Filled 2023-03-09: qty 6

## 2023-11-04 ENCOUNTER — Telehealth: Payer: BC Managed Care – PPO | Admitting: Physician Assistant

## 2023-11-04 DIAGNOSIS — K120 Recurrent oral aphthae: Secondary | ICD-10-CM | POA: Diagnosis not present

## 2023-11-04 MED ORDER — TRIAMCINOLONE ACETONIDE 0.1 % MT PSTE: 1.0000 | PASTE | Freq: Three times a day (TID) | OROMUCOSAL | 0 refills | Status: AC

## 2023-11-04 NOTE — Progress Notes (Signed)
E-Visit for Mouth Ulcers  We are sorry that you are not feeling well.  Here is how we plan to help!  Based on what you have shared with me, it appears that you do have mouth ulcer(s).     The following medications should decrease the discomfort and help with healing. Biotene mouthwash 3 times daily (Available over the counter), Orajel (Available over the counter), and Triamcinolone Dental Paste apply 3 times daily as needed for up to 3 days (Prescription sent to your pharmacy)  Mouth ulcers are painful areas in the mouth and gums. These are also known as "canker sores".  They can occur anywhere inside the mouth. While mostly harmless, mouth ulcers can be extremely uncomfortable and may make it difficult to eat, drink, and brush your teeth.  You may have more than 1 ulcer and they can vary and change in size. Mouth ulcers are not contagious and should not be confused with cold sores.  Cold sores appear on the lip or around the outside of the mouth and often begin with a tingling, burning or itching sensation.   While the exact causes are unknown, some common causes and factors that may aggravate mouth ulcers include: Genetics - Sometimes mouth ulcers run in families High alcohol intake Acidic foods such as citrus fruits like pineapple, grapefruit, orange fruits/juices, may aggravate mouth ulcers Other foods high in acidity or spice such as coffee, chocolate, chips, pretzels, eggs, nuts, cheese Quitting smoking Injury caused by biting the tongue or inside of the cheek Diet lacking in B-12, zinc, folic acid or iron Female hormone shifts with menstruation Excessive fatigue, emotional stress or anxiety Prevention: Talk to your doctor if you are taking meds that are known to cause mouth ulcers such as:   Anti-inflammatory drugs (for example Ibuprofen, Naproxen sodium), pain killers, Beta blockers, Oral nicotine replacement drugs, Some street drugs (heroin).   Avoid allowing any tablets to dissolve  in your mouth that are meant to swallowed whole Avoid foods/drinks that trigger or worsen symptoms Keep your mouth clean with daily brushing and flossing  Home Care: The goal with treatment is to ease the pain where ulcers occur and help them heal as quickly as possible.  There is no medical treatment to prevent mouth ulcers from coming back or recurring.  Avoid spicy and acidic foods Eat soft foods and avoid rough, crunchy foods Avoid chewing gum Do not use toothpaste that contains sodium lauryl sulphite Use a straw to drink which helps avoid liquids toughing the ulcers near the front of your mouth Use a very soft toothbrush If you have dentures or dental hardware that you feel is not fitting well or contributing to his, please see your dentist. Use saltwater mouthwash which helps healing. Dissolve a  teaspoon of salt in a glass of warm water. Swish around your mouth and spit it out. This can be used as needed if it is soothing.   GET HELP RIGHT AWAY IF: Persistent ulcers require checking IN PERSON (face to face). Any mouth lesion lasting longer than a month should be seen by your DENTIST as soon as possible for evaluation for possible oral cancer. If you have a non-painful ulcer in 1 or more areas of your mouth Ulcers that are spreading, are very large or particularly painful Ulcers last longer than one week without improving on treatment If you develop a fever, swollen glands and begin to feel unwell Ulcers that developed after starting a new medication MAKE SURE YOU: Understand these  instructions. Will watch your condition. Will get help right away if you are not doing well or get worse.  Thank you for choosing an e-visit.  Your e-visit answers were reviewed by a board certified advanced clinical practitioner to complete your personal care plan. Depending upon the condition, your plan could have included both over the counter or prescription medications.  Please review your  pharmacy choice. Make sure the pharmacy is open so you can pick up prescription now. If there is a problem, you may contact your provider through [employer] and have the prescription routed to another pharmacy.  Your safety is important to [place]. If you have drug allergies check your prescription carefully.   For the next 24 hours you can use MyChart to ask questions about today's visit, request a non-urgent call back, or ask for a work or school excuse. You will get an email in the next two days asking about your experience. I hope that your e-visit has been valuable and will speed your recovery.

## 2023-11-04 NOTE — Progress Notes (Signed)
I have spent 5 minutes in review of e-visit questionnaire, review and updating patient chart, medical decision making and response to patient.   Mia Milan Cody Jacklynn Dehaas, PA-C    

## 2023-11-24 ENCOUNTER — Other Ambulatory Visit: Payer: Self-pay | Admitting: Obstetrics and Gynecology

## 2023-11-24 DIAGNOSIS — Z1231 Encounter for screening mammogram for malignant neoplasm of breast: Secondary | ICD-10-CM

## 2024-02-28 ENCOUNTER — Ambulatory Visit
Admission: RE | Admit: 2024-02-28 | Discharge: 2024-02-28 | Disposition: A | Discharge status: Home / Self Care | Payer: 59 | Source: Ambulatory Visit | Attending: Obstetrics and Gynecology | Admitting: Obstetrics and Gynecology

## 2024-02-28 DIAGNOSIS — Z1231 Encounter for screening mammogram for malignant neoplasm of breast: Secondary | ICD-10-CM | POA: Insufficient documentation

## 2024-03-13 ENCOUNTER — Telehealth: Admitting: Family Medicine

## 2024-03-13 DIAGNOSIS — K529 Noninfective gastroenteritis and colitis, unspecified: Secondary | ICD-10-CM | POA: Diagnosis not present

## 2024-03-13 NOTE — Progress Notes (Signed)
 E-Visit for Nausea and Vomiting    We are sorry that you are not feeling well. Here is how we plan to help!   Based on what you have shared with me it looks like you have a Virus that is irritating your GI tract.  Vomiting is the forceful emptying of a portion of the stomach's content through the mouth.  Although nausea and vomiting can make you feel miserable, it's important to remember that these are not diseases, but rather symptoms of an underlying illness.  When we treat short term symptoms, we always caution that any symptoms that persist should be fully evaluated in a medical office.     HOME CARE: Drink clear liquids.  This is very important! Dehydration (the lack of fluid) can lead to a serious complication.  Start off with 1 tablespoon every 5 minutes for 8 hours. You may begin eating bland foods after 8 hours without vomiting.  Start with saltine crackers, white bread, rice, mashed potatoes, applesauce. After 48 hours on a bland diet, you may resume a normal diet. Try to go to sleep.  Sleep often empties the stomach and relieves the need to vomit.   GET HELP RIGHT AWAY IF:   Your symptoms do not improve or worsen within 2 days after treatment. You have a fever 100.4 or higher for over 3 days. You cannot keep down fluids after trying the medication.   MAKE SURE YOU:   Understand these instructions. Will watch your condition. Will get help right away if you are not doing well or get worse.     Thank you for choosing an e-visit.   Your e-visit answers were reviewed by a board certified advanced clinical practitioner to complete your personal care plan. Depending upon the condition, your plan could have included both over the counter or prescription medications.   Please review your pharmacy choice. Make sure the pharmacy is open so you can pick up prescription now. If there is a problem, you may contact your provider through [employer] and have the prescription routed to  another pharmacy.  Your safety is important to [place]. If you have drug allergies check your prescription carefully.    For the next 24 hours you can use MyChart to ask questions about today's visit, request a non-urgent call back, or ask for a work or school excuse. You will get an email in the next two days asking about your experience. I hope that your e-visit has been valuable and will speed your recovery.   I provided 5 minutes of non face-to-face time during this encounter for chart review, medication and order placement, as well as and documentation.

## 2024-04-28 ENCOUNTER — Ambulatory Visit (INDEPENDENT_AMBULATORY_CARE_PROVIDER_SITE_OTHER): Payer: Self-pay

## 2024-04-28 DIAGNOSIS — K641 Second degree hemorrhoids: Secondary | ICD-10-CM | POA: Diagnosis not present

## 2024-04-28 DIAGNOSIS — Z860101 Personal history of adenomatous and serrated colon polyps: Secondary | ICD-10-CM | POA: Diagnosis not present

## 2024-04-28 DIAGNOSIS — D120 Benign neoplasm of cecum: Secondary | ICD-10-CM

## 2024-04-28 DIAGNOSIS — Z09 Encounter for follow-up examination after completed treatment for conditions other than malignant neoplasm: Secondary | ICD-10-CM | POA: Diagnosis present

## 2024-05-30 ENCOUNTER — Other Ambulatory Visit: Payer: Self-pay | Admitting: Family Medicine

## 2024-05-30 ENCOUNTER — Ambulatory Visit
Admission: RE | Admit: 2024-05-30 | Discharge: 2024-05-30 | Disposition: A | Discharge status: Home / Self Care | Source: Ambulatory Visit | Attending: Family Medicine | Admitting: Family Medicine

## 2024-05-30 DIAGNOSIS — R509 Fever, unspecified: Secondary | ICD-10-CM

## 2024-05-30 DIAGNOSIS — R1011 Right upper quadrant pain: Secondary | ICD-10-CM | POA: Insufficient documentation

## 2025-02-02 ENCOUNTER — Other Ambulatory Visit: Payer: Self-pay | Admitting: Obstetrics and Gynecology

## 2025-02-02 DIAGNOSIS — Z1231 Encounter for screening mammogram for malignant neoplasm of breast: Secondary | ICD-10-CM

## 2025-02-28 ENCOUNTER — Encounter
